# Patient Record
Sex: Female | Born: 1990 | Race: Black or African American | Hispanic: No | Marital: Single | Smoking: Never smoker
Health system: Southern US, Community
[De-identification: ages and names within clinical notes are randomized; demographics above are authoritative.]

## PROBLEM LIST (undated history)

## (undated) DIAGNOSIS — K900 Celiac disease: Secondary | ICD-10-CM

## (undated) HISTORY — PX: TONSILLECTOMY: SUR1361

---
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
## 2011-12-22 ENCOUNTER — Encounter (HOSPITAL_COMMUNITY): Payer: Self-pay | Admitting: Emergency Medicine

## 2011-12-22 ENCOUNTER — Emergency Department (INDEPENDENT_AMBULATORY_CARE_PROVIDER_SITE_OTHER)
Admission: EM | Admit: 2011-12-22 | Discharge: 2011-12-22 | Disposition: A | Discharge status: Home / Self Care | Payer: PRIVATE HEALTH INSURANCE | Source: Home / Self Care | Attending: Family Medicine | Admitting: Family Medicine

## 2011-12-22 ENCOUNTER — Emergency Department (INDEPENDENT_AMBULATORY_CARE_PROVIDER_SITE_OTHER): Payer: PRIVATE HEALTH INSURANCE

## 2011-12-22 DIAGNOSIS — S93609A Unspecified sprain of unspecified foot, initial encounter: Secondary | ICD-10-CM

## 2011-12-22 HISTORY — DX: Celiac disease: K90.0

## 2011-12-22 NOTE — ED Provider Notes (Signed)
History     CSN: [id number]  Arrival date & time 12/22/11  1347   First MD Initiated Contact with Patient 12/22/11 1430      Chief Complaint  Patient presents with  . Foot Pain    (Consider location/radiation/quality/duration/timing/severity/associated sxs/prior treatment) HPI Comments: Olivia Johns presents for evaluation of pain and swelling in her left foot. Olivia Johns reports that Olivia Johns tripped yesterday evening, plantar flexing her foot, but Olivia Johns is really unsure. Olivia Johns has been able to ambulate on the foot, but with noticeable pain. There is also swelling around the dorsum of the midfoot, and lateral malleolus. There is no obvious bruising or deformity. Olivia Johns states that Olivia Johns was wearing closed toed tennis shoes at the time. Olivia Johns denies any numbness, tingling, or weakness.  Patient is a 21 y.o. female presenting with foot injury. The history is provided by the patient.  Foot Injury  The incident occurred yesterday. The incident occurred at home. The injury mechanism was a fall. The pain is present in the left foot. The quality of the pain is described as aching. The pain is moderate. The pain has been constant since onset. Pertinent negatives include no numbness, no inability to bear weight, no loss of sensation and no tingling. The symptoms are aggravated by bearing weight, palpation and activity. Olivia Johns has tried rest for the symptoms.    Past Medical History  Diagnosis Date  . Vitamin d deficiency   . Celiac disease   . Vitamin d deficiency     Past Surgical History  Procedure Date  . Tonsillectomy     No family history on file.  History  Substance Use Topics  . Smoking status: Never Smoker   . Smokeless tobacco: Not on file  . Alcohol Use: No    OB History    Grav Para Term Preterm Abortions TAB SAB Ect Mult Living                  Review of Systems  Constitutional: Negative.   HENT: Negative.   Eyes: Negative.   Respiratory: Negative.   Cardiovascular: Negative.     Gastrointestinal: Negative.   Genitourinary: Negative.   Musculoskeletal:       LEFT foot swelling and pain  Skin: Negative.   Neurological: Negative.  Negative for tingling and numbness.    Allergies  Review of patient's allergies indicates no known allergies.  Home Medications   Current Outpatient Rx  Name Route Sig Dispense Refill  . LEVORA 0.15/30 (28) PO Oral Take by mouth.    Marland Kitchen BIOTIN PO Oral Take 1 tablet by mouth daily.    . IBUPROFEN 800 MG PO TABS Oral Take 1 tablet (800 mg total) by mouth every 8 (eight) hours as needed for pain. 30 tablet 0  . ADULT MULTIVITAMIN W/MINERALS CH Oral Take 1 tablet by mouth daily.    Marland Kitchen VITAMIN D (ERGOCALCIFEROL) 50000 UNITS PO CAPS Oral Take 50,000 Units by mouth 3 (three) times a week. Every Monday, Wednesday, and Friday      BP 125/71  Pulse 85  Temp(Src) 98.6 F (37 C) (Oral)  Resp 16  SpO2 97%  LMP 12/15/2011  Physical Exam  Nursing note and vitals reviewed. Constitutional: Olivia Johns is oriented to person, place, and time. Olivia Johns appears well-developed and well-nourished.  HENT:  Head: Normocephalic and atraumatic.  Eyes: EOM are normal.  Neck: Normal range of motion.  Pulmonary/Chest: Effort normal.  Musculoskeletal: Normal range of motion.       Left ankle: Olivia Johns  exhibits normal range of motion, no swelling, no deformity and normal pulse. No lateral malleolus, no medial malleolus, no AITFL, no CF ligament, no posterior TFL, no head of 5th metatarsal and no proximal fibula tenderness found.       Left foot: Olivia Johns exhibits tenderness and swelling. Olivia Johns exhibits normal range of motion, normal capillary refill, no deformity and no laceration.       Feet:  Neurological: Olivia Johns is alert and oriented to person, place, and time.  Skin: Skin is warm and dry.  Psychiatric: Her behavior is normal.    ED Course  Procedures (including critical care time)  Labs Reviewed - No data to display Dg Chest 2 View  12/23/2011  *RADIOLOGY REPORT*   Clinical Data: 21 year old female with chest pain, extremity swelling.  CHEST - 2 VIEW  Comparison: 09/23/2010.  Findings: Normal lung volumes. Normal cardiac size and mediastinal contours.  Visualized tracheal air column is within normal limits. No pneumothorax, pulmonary edema, pleural effusion or confluent pulmonary opacity. No osseous abnormality identified.  IMPRESSION: Negative, no acute cardiopulmonary abnormality.  Original Report Authenticated By: Harley Hallmark, M.D.   Dg Foot Complete Left  12/22/2011  *RADIOLOGY REPORT*  Clinical Data: Foot pain for 2 days.  LEFT FOOT - COMPLETE 3+ VIEW  Comparison: None.  Findings: Imaged bones, joints and soft tissues appear normal.  IMPRESSION: Negative exam.  Original Report Authenticated By: Bernadene Bell. D'ALESSIO, M.D.     1. Foot sprain       MDM  Xray reviewed by radiologist and myself, no acute findings; no fracture; placed in post-op shoe for comfort; given [facility] for follow-up if no improvement in sx        Renaee Munda, MD 12/23/11 [phone]

## 2011-12-22 NOTE — Discharge Instructions (Signed)
Your xrays were negative for any fracture or bony abnormality. Wear the hard-soled shoe for at least a week, then try walking without it. If you are pain-free, you can discontinue the shoe. If your pain persists, continue the shoe for another week. If you are still having pain after 2 weeks, then re-present for evaluation. Take ibuprofen or naproxen (Aleve) around the clock for baseline pain control. Use prescription pain medication only as needed and as directed for severe pain. If no improvement, please call the Orthopaedic practice listed on your discharge papers for further evaluation.

## 2011-12-22 NOTE — ED Notes (Signed)
Noticed left foot swelling yesterday.  Does remember tripping yesterday, but did not notice pain/swelling until much later that evening and not sure if tripping was related.  Pain includes little toe and radiates along outer edge of foot to the heel, but not including the heel.  Reports uncomfortable to walk.  Wiggles toes, no numbness or tingling.

## 2011-12-23 ENCOUNTER — Emergency Department (HOSPITAL_COMMUNITY): Payer: No Typology Code available for payment source

## 2011-12-23 ENCOUNTER — Encounter (HOSPITAL_COMMUNITY): Payer: Self-pay

## 2011-12-23 ENCOUNTER — Other Ambulatory Visit: Payer: Self-pay

## 2011-12-23 ENCOUNTER — Emergency Department (HOSPITAL_COMMUNITY)
Admission: EM | Admit: 2011-12-23 | Discharge: 2011-12-23 | Disposition: A | Discharge status: Home / Self Care | Payer: No Typology Code available for payment source | Attending: Emergency Medicine | Admitting: Emergency Medicine

## 2011-12-23 DIAGNOSIS — R0602 Shortness of breath: Secondary | ICD-10-CM

## 2011-12-23 DIAGNOSIS — R072 Precordial pain: Secondary | ICD-10-CM | POA: Insufficient documentation

## 2011-12-23 DIAGNOSIS — I82459 Acute embolism and thrombosis of unspecified peroneal vein: Secondary | ICD-10-CM

## 2011-12-23 DIAGNOSIS — M7989 Other specified soft tissue disorders: Secondary | ICD-10-CM

## 2011-12-23 DIAGNOSIS — M79609 Pain in unspecified limb: Secondary | ICD-10-CM | POA: Insufficient documentation

## 2011-12-23 DIAGNOSIS — I824Z9 Acute embolism and thrombosis of unspecified deep veins of unspecified distal lower extremity: Secondary | ICD-10-CM | POA: Insufficient documentation

## 2011-12-23 LAB — POCT I-STAT, CHEM 8
BUN: 5 mg/dL — ABNORMAL LOW (ref 6–23)
Calcium, Ion: 1.22 mmol/L (ref 1.12–1.32)
Chloride: 104 mEq/L (ref 96–112)
HCT: 36 % (ref 36.0–46.0)
Sodium: 140 mEq/L (ref 135–145)

## 2011-12-23 LAB — PREGNANCY, URINE: Preg Test, Ur: NEGATIVE

## 2011-12-23 MED ORDER — IBUPROFEN 800 MG PO TABS
800.0000 mg | ORAL_TABLET | Freq: Once | ORAL | Status: AC
Start: 2011-12-23 — End: 2011-12-23
  Administered 2011-12-23: 800 mg via ORAL
  Filled 2011-12-23: qty 1

## 2011-12-23 MED ORDER — ACETAMINOPHEN 325 MG PO TABS
650.0000 mg | ORAL_TABLET | Freq: Once | ORAL | Status: AC
  Administered 2011-12-23: 650 mg via ORAL
  Filled 2011-12-23: qty 2

## 2011-12-23 MED ORDER — KETOROLAC TROMETHAMINE 60 MG/2ML IM SOLN
60.0000 mg | Freq: Once | INTRAMUSCULAR | Status: DC
  Filled 2011-12-23: qty 2

## 2011-12-23 MED ORDER — ENOXAPARIN SODIUM 100 MG/ML ~~LOC~~ SOLN
70.0000 mg | Freq: Once | SUBCUTANEOUS | Status: AC
  Administered 2011-12-23: 70 mg via SUBCUTANEOUS
  Filled 2011-12-23: qty 1

## 2011-12-23 MED ORDER — POTASSIUM CHLORIDE CRYS ER 20 MEQ PO TBCR
40.0000 meq | EXTENDED_RELEASE_TABLET | Freq: Once | ORAL | Status: AC
  Administered 2011-12-23: 40 meq via ORAL
  Filled 2011-12-23: qty 2

## 2011-12-23 MED ORDER — ENOXAPARIN SODIUM 150 MG/ML ~~LOC~~ SOLN: 1.0000 mg/kg | Freq: Two times a day (BID) | SUBCUTANEOUS | Status: DC

## 2011-12-23 MED ORDER — IBUPROFEN 800 MG PO TABS: 800.0000 mg | ORAL_TABLET | Freq: Three times a day (TID) | ORAL | Status: DC | PRN

## 2011-12-23 MED ORDER — IOHEXOL 350 MG/ML SOLN
70.0000 mL | Freq: Once | INTRAVENOUS | Status: AC | PRN
  Administered 2011-12-23: 70 mL via INTRAVENOUS

## 2011-12-23 MED ORDER — OXYCODONE-ACETAMINOPHEN 5-325 MG PO TABS: 1.0000 | ORAL_TABLET | ORAL | Status: DC | PRN

## 2011-12-23 MED ORDER — PATIENT'S GUIDE TO USING COUMADIN BOOK
Freq: Once | Status: AC
  Administered 2011-12-23: 19:00:00
  Filled 2011-12-23: qty 1

## 2011-12-23 MED ORDER — ENOXAPARIN (LOVENOX) PATIENT EDUCATION KIT
PACK | Freq: Once | Status: AC
  Administered 2011-12-23: 18:00:00
  Filled 2011-12-23: qty 1

## 2011-12-23 MED ORDER — WARFARIN SODIUM 5 MG PO TABS: 5.0000 mg | ORAL_TABLET | Freq: Every day | ORAL | Status: DC

## 2011-12-23 MED ORDER — WARFARIN SODIUM 5 MG PO TABS
5.0000 mg | ORAL_TABLET | Freq: Once | ORAL | Status: AC
  Administered 2011-12-23: 5 mg via ORAL
  Filled 2011-12-23: qty 1

## 2011-12-23 NOTE — ED Notes (Signed)
Pt to vascular for doppler study

## 2011-12-23 NOTE — Progress Notes (Signed)
*  PRELIMINARY RESULTS* Vascular Ultrasound Left lower extremity venous duplex has been completed.   No evidence of right common femoral vein deep vein thrombosis. No evidence of left lower extremity deep vein thrombosis with the exception of the proximal to distal segments of the peroneal veins which are noncompressible and dilated with debris visualized within the lumen, which is suggestive of acute thrombosis.  Malachy Moan, RDMS, RDCS 12/23/2011, 1:44 PM

## 2011-12-23 NOTE — ED Provider Notes (Signed)
History     CSN: [id number]  Arrival date & time 12/23/11  4098   First MD Initiated Contact with Patient 12/23/11 0630      Chief Complaint  Patient presents with  . Chest Pain    (Consider location/radiation/quality/duration/timing/severity/associated sxs/prior treatment) HPI The patient presents to the ER with chest pain that started this morning as pain along her sternum. She states that the pain is worse with movement, breathing deep and palpation. The patient states that she has no cardiac history. She states that she has had no leg or calf swelling, no redness in either leg, or shortness of breath. Patient denies N/V, sweating, weakness, abd pain, fever, cough, headache or visual changes. The patient did not try any treatment for her symptoms. Past Medical History  Diagnosis Date  . Vitamin d deficiency   . Celiac disease   . Vitamin d deficiency     Past Surgical History  Procedure Date  . Tonsillectomy     History reviewed. No pertinent family history.  History  Substance Use Topics  . Smoking status: Never Smoker   . Smokeless tobacco: Not on file  . Alcohol Use: No    OB History    Grav Para Term Preterm Abortions TAB SAB Ect Mult Living                  Review of Systems All pertinent positives and negatives reviewed in the history of present illness  Allergies  Review of patient's allergies indicates no known allergies.  Home Medications   Current Outpatient Rx  Name Route Sig Dispense Refill  . BIOTIN PO Oral Take 1 tablet by mouth daily.    Marland Kitchen LEVORA 0.15/30 (28) PO Oral Take by mouth.    . ADULT MULTIVITAMIN W/MINERALS CH Oral Take 1 tablet by mouth daily.    Marland Kitchen VITAMIN D (ERGOCALCIFEROL) 50000 UNITS PO CAPS Oral Take 50,000 Units by mouth 3 (three) times a week. Every Monday, Wednesday, and Friday      BP 124/64  Temp(Src) 99.1 F (37.3 C) (Oral)  Resp 24  SpO2 100%  LMP 12/15/2011  Physical Exam  Constitutional: She appears  well-developed and well-nourished. No distress.  HENT:  Head: Normocephalic and atraumatic.  Mouth/Throat: Oropharynx is clear and moist. No oropharyngeal exudate.  Eyes: Pupils are equal, round, and reactive to light.  Cardiovascular: Normal rate, regular rhythm and normal heart sounds.  Exam reveals no gallop and no friction rub.   No murmur heard. Pulmonary/Chest: Effort normal and breath sounds normal. No respiratory distress. She has no wheezes. She has no rales. She exhibits tenderness. She exhibits no crepitus and no deformity.    Musculoskeletal:       Right lower leg: She exhibits no swelling and no edema.       Left lower leg: She exhibits no swelling and no edema.    ED Course  Procedures (including critical care time)   Labs Reviewed  PREGNANCY, URINE   Dg Chest 2 View  12/23/2011  *RADIOLOGY REPORT*  Clinical Data: 21 year old female with chest pain, extremity swelling.  CHEST - 2 VIEW  Comparison: 09/23/2010.  Findings: Normal lung volumes. Normal cardiac size and mediastinal contours.  Visualized tracheal air column is within normal limits. No pneumothorax, pulmonary edema, pleural effusion or confluent pulmonary opacity. No osseous abnormality identified.  IMPRESSION: Negative, no acute cardiopulmonary abnormality.  Original Report Authenticated By: Harley Hallmark, M.D.   Dg Foot Complete Left  12/22/2011  *RADIOLOGY REPORT*  Clinical Data: Foot pain for 2 days.  LEFT FOOT - COMPLETE 3+ VIEW  Comparison: None.  Findings: Imaged bones, joints and soft tissues appear normal.  IMPRESSION: Negative exam.  Original Report Authenticated By: Bernadene Bell. Maricela Curet, M.D.    The patient has what appears to be chest wall type pain. She is low risk based on Wells Criteria. She has no cardiac history or predisposing factors for early cardiac disease. The patient is advised to return here as needed. These symptoms seem atypical for cardiac chest pain.  10:44 AM The patient now is  complaining of lower extremity pain that she did not previously mention.The patient states now that this is her main complaint at this time. I advised that we will check a venous doppler.  02:45 PM The patient is found to have a small clot in her lower peroneal vein. The patient states she is now having chest discomfort again.  Will move to the CDU for further care and eval. CTA ordered of her chest. MDM   Date: 12/23/2011  Rate: *80   Rhythm: normal sinus rhythm  QRS Axis: normal  Intervals: normal  ST/T Wave abnormalities: normal  Conduction Disutrbances:none  Narrative Interpretation:   Old EKG Reviewed: none available          Carlyle Dolly, PA-C 12/23/11 0859  Carlyle Dolly, PA-C 12/23/11 0900  Carlyle Dolly, PA-C 12/23/11 1457

## 2011-12-23 NOTE — ED Notes (Signed)
Woke with CP 8/10 described as a dull flutter.   Sprained ankle yesterday foot turning blue but color improves with ambulation

## 2011-12-23 NOTE — ED Notes (Signed)
Phoned CT to inquire about exam. Patient is on their list and they will come get the patient shortly.

## 2011-12-23 NOTE — Discharge Instructions (Signed)
Olivia Johns you have a very small clot to the perineal vein today.  We will start you on Lovenox shots and Coumadin and today. He will give yourself the shots twice a day every 12 hours. You will take the Coumadin once a day. On Sunday he will go to the urgent care at [facility] on the located at [address] The phone number is [phone]. Call for an appointment tonight. You will be getting her INR checked. From there he will be instructed on how to take your medication. Return to the ER for severe shortness of breath or severe chest pain. Do not take ibuprofen while on coumadin.  Continue the birth control pills. There is also a pneumomediastinum present which is a small amount of air outside the lung.  This will be absorbed but return if you have increased chest pain that is severe.    Deep Vein Thrombosis A deep vein thrombosis (DVT) is a blood clot (thrombus) that develops in a deep vein. A DVT is a clot in the deep, larger veins of the leg, arm, or pelvis. These are more dangerous than clots that might form in veins on the surface of the body. Deep vein thrombosis can lead to complications if the clot breaks off and travels in the bloodstream to the lungs. CAUSES Blood clots form in a vein for different reasons. Usually several things cause blood clots. They include:  The flow of blood slows down.   The inside of the vein is damaged in some way.   The person has a condition that makes blood clot more easily. These conditions may include:   Older age (especially over 30 years old).   Having a history of blood clots.   Having major or lengthy surgery. Hip surgery is particularly high-risk.   Breaking a hip or leg.   Sitting or lying still for a long time.   Cancer or cancer treatment.   Having a long, thin tube (catheter) placed inside a vein during a medical procedure.   Being overweight (obese).   Pregnancy and childbirth.   Medicines with estrogen.   Smoking.   Other  circulation or heart problems.  SYMPTOMS When a clot forms, it can either partially or totally block the blood flow in that vein. Symptoms of a DVT can include:  Swelling of the leg or arm, especially if one side is much worse.   Warmth and redness of the leg or arm, especially if one side is much worse.   Pain in an arm or leg. If the clot is in the leg, symptoms may be more noticeable or worse when standing or walking.  If the blood clot travels to the lung, it may cause:  Shortness of breath.   Chest pain. The pain may be worsened by deep breaths.   Coughing up thick mucus (phlegm), possibly flecked with blood.  Anyone with these symptoms should get emergency medical treatment right away. Call your local emergency services (911 in [place] if you have these symptoms. DIAGNOSIS If a DVT is suspected, your caregiver will take a full medical history. He or she will also perform a physical exam. Tests that also may be required include:  Studies of the clotting properties of the blood.   An ultrasound scan.   X-rays to show the flow of blood when special dye is injected into the veins (venography).   Studies of your lungs if you have any chest symptoms.  PREVENTION  Exercise the  legs regularly. Take a brisk 30 minute walk every day.   Maintain a weight that is appropriate for your height.   Avoid sitting or lying in bed for long periods of time without moving your legs.   Women, particularly those over the age of 65, should consider the risks and benefits of taking estrogen medicines, including birth control pills.   Do not smoke, especially if you take estrogen medicines.   Long-distance travel can increase your risk. You should exercise your legs by walking or pumping the muscles every hour.   In hospital prevention:   Prevention may include medical and nonmedical measures.  TREATMENT  The most common treatment for DVT is blood thinning (anticoagulant) medicine, which  reduces the blood's tendency to clot. Anticoagulants can stop new blood clots from forming and old ones from growing. They cannot dissolve existing clots. Your body does this by itself over time. Anticoagulants can be given by mouth, by intravenous (IV) access, or by injection. Your caregiver will determine the best program for you.   Less commonly, clot-dissolving drugs (thrombolytics) are used to dissolve a DVT. They carry a high risk of bleeding, so they are used mainly in severe cases.   Very rarely, a blood clot in the leg needs to be removed surgically.   If you are unable to take anticoagulants, your caregiver may arrange for you to have a filter placed in a main vein in your belly (abdomen). This filter prevents clots from traveling to your lungs.  HOME CARE INSTRUCTIONS  Take all medicines prescribed by your caregiver. Follow the directions carefully.   You will most likely continue taking anticoagulants after you leave the hospital. Your caregiver will advise you on the length of treatment (usually 3 to 6 months, sometimes for life).   Taking too much or too little of an anticoagulant is dangerous. While taking this type of medicine, you will need to have regular blood tests to be sure the dose is correct. The dose can change for many reasons. It is critically important that you take this medicine exactly as prescribed, and that you have blood tests exactly as directed.   Many foods can interfere with anticoagulants. These include foods high in vitamin K, such as spinach, kale, broccoli, cabbage, collard and turnip greens, Brussels sprouts, peas, cauliflower, seaweed, parsley, beef and pork liver, green tea, and soybean oil. Your caregiver should discuss limits on these foods with you or you should arrange a visit with a dietician to answer your questions.   Many medicines can interfere with anticoagulants. You must tell your caregiver about any and all medicines you take. This includes  all vitamins and supplements. Be especially cautious with aspirin and anti-inflammatory medicines. Ask your caregiver before taking these.   Anticoagulants can have side effects, mostly excessive bruising or bleeding. You will need to hold pressure over cuts for longer than usual. Avoid alcoholic drinks or consume only very small amounts while taking this medicine.   If you are taking an anticoagulant:   Wear a medical alert bracelet.   Notify your dentist or other caregivers before procedures.   Avoid contact sports.   Ask your caregiver how soon you can go back to normal activities. Not being active can lead to new clots. Ask for a list of what you should and should not do.   Exercise your lower leg muscles. This is important while traveling.   You may need to wear compression stockings. These are tight elastic stockings that  apply pressure to the lower legs. This can help keep the blood in the legs from clotting.   If you are a smoker, you should quit.   Learn as much as you can about DVT.  SEEK MEDICAL CARE IF:  You have unusual bruising or any bleeding problems.   The swelling or pain in your affected arm or leg is not gradually improving.   You anticipate surgery or long-distance travel. You should get specific advice on DVT prevention.   You discover other family members with blood clots. This may require further testing for inherited diseases or conditions.  SEEK IMMEDIATE MEDICAL CARE IF:  You develop chest pain.   You develop severe shortness of breath.   You begin to cough up bloody mucus or phlegm (sputum).   You feel dizzy or faint.   You develop swelling or pain in the leg.   You have breathing problems after traveling.  MAKE SURE YOU:  Understand these instructions.   Will watch your condition.   Will get help right away if you are not doing well or get worse.  Document Released: 08/22/2005 Document Revised: 08/11/2011 Document Reviewed:  10/14/2010 [facility] Patient Information 2012 [place], [place].Deep Vein Thrombosis A deep vein thrombosis (DVT) is a blood clot (thrombus) that develops in a deep vein. A DVT is a clot in the deep, larger veins of the leg, arm, or pelvis. These are more dangerous than clots that might form in veins on the surface of the body. Deep vein thrombosis can lead to complications if the clot breaks off and travels in the bloodstream to the lungs. CAUSES Blood clots form in a vein for different reasons. Usually several things cause blood clots. They include:  The flow of blood slows down.   The inside of the vein is damaged in some way.   The person has a condition that makes blood clot more easily. These conditions may include:   Older age (especially over 15 years old).   Having a history of blood clots.   Having major or lengthy surgery. Hip surgery is particularly high-risk.   Breaking a hip or leg.   Sitting or lying still for a long time.   Cancer or cancer treatment.   Having a long, thin tube (catheter) placed inside a vein during a medical procedure.   Being overweight (obese).   Pregnancy and childbirth.   Medicines with estrogen.   Smoking.   Other circulation or heart problems.  SYMPTOMS When a clot forms, it can either partially or totally block the blood flow in that vein. Symptoms of a DVT can include:  Swelling of the leg or arm, especially if one side is much worse.   Warmth and redness of the leg or arm, especially if one side is much worse.   Pain in an arm or leg. If the clot is in the leg, symptoms may be more noticeable or worse when standing or walking.  If the blood clot travels to the lung, it may cause:  Shortness of breath.   Chest pain. The pain may be worsened by deep breaths.   Coughing up thick mucus (phlegm), possibly flecked with blood.  Anyone with these symptoms should get emergency medical treatment right away. Call your local emergency  services (911 in [place] if you have these symptoms. DIAGNOSIS If a DVT is suspected, your caregiver will take a full medical history. He or she will also perform a physical exam. Tests that also may be required  include:  Studies of the clotting properties of the blood.   An ultrasound scan.   X-rays to show the flow of blood when special dye is injected into the veins (venography).   Studies of your lungs if you have any chest symptoms.  PREVENTION  Exercise the legs regularly. Take a brisk 30 minute walk every day.   Maintain a weight that is appropriate for your height.   Avoid sitting or lying in bed for long periods of time without moving your legs.   Women, particularly those over the age of 63, should consider the risks and benefits of taking estrogen medicines, including birth control pills.   Do not smoke, especially if you take estrogen medicines.   Long-distance travel can increase your risk. You should exercise your legs by walking or pumping the muscles every hour.   In hospital prevention:   Prevention may include medical and nonmedical measures.  TREATMENT  The most common treatment for DVT is blood thinning (anticoagulant) medicine, which reduces the blood's tendency to clot. Anticoagulants can stop new blood clots from forming and old ones from growing. They cannot dissolve existing clots. Your body does this by itself over time. Anticoagulants can be given by mouth, by intravenous (IV) access, or by injection. Your caregiver will determine the best program for you.   Less commonly, clot-dissolving drugs (thrombolytics) are used to dissolve a DVT. They carry a high risk of bleeding, so they are used mainly in severe cases.   Very rarely, a blood clot in the leg needs to be removed surgically.   If you are unable to take anticoagulants, your caregiver may arrange for you to have a filter placed in a main vein in your belly (abdomen). This filter prevents clots from  traveling to your lungs.  HOME CARE INSTRUCTIONS  Take all medicines prescribed by your caregiver. Follow the directions carefully.   You will most likely continue taking anticoagulants after you leave the hospital. Your caregiver will advise you on the length of treatment (usually 3 to 6 months, sometimes for life).   Taking too much or too little of an anticoagulant is dangerous. While taking this type of medicine, you will need to have regular blood tests to be sure the dose is correct. The dose can change for many reasons. It is critically important that you take this medicine exactly as prescribed, and that you have blood tests exactly as directed.   Many foods can interfere with anticoagulants. These include foods high in vitamin K, such as spinach, kale, broccoli, cabbage, collard and turnip greens, Brussels sprouts, peas, cauliflower, seaweed, parsley, beef and pork liver, green tea, and soybean oil. Your caregiver should discuss limits on these foods with you or you should arrange a visit with a dietician to answer your questions.   Many medicines can interfere with anticoagulants. You must tell your caregiver about any and all medicines you take. This includes all vitamins and supplements. Be especially cautious with aspirin and anti-inflammatory medicines. Ask your caregiver before taking these.   Anticoagulants can have side effects, mostly excessive bruising or bleeding. You will need to hold pressure over cuts for longer than usual. Avoid alcoholic drinks or consume only very small amounts while taking this medicine.   If you are taking an anticoagulant:   Wear a medical alert bracelet.   Notify your dentist or other caregivers before procedures.   Avoid contact sports.   Ask your caregiver how soon you can go back to  normal activities. Not being active can lead to new clots. Ask for a list of what you should and should not do.   Exercise your lower leg muscles. This is  important while traveling.   You may need to wear compression stockings. These are tight elastic stockings that apply pressure to the lower legs. This can help keep the blood in the legs from clotting.   If you are a smoker, you should quit.   Learn as much as you can about DVT.  SEEK MEDICAL CARE IF:  You have unusual bruising or any bleeding problems.   The swelling or pain in your affected arm or leg is not gradually improving.   You anticipate surgery or long-distance travel. You should get specific advice on DVT prevention.   You discover other family members with blood clots. This may require further testing for inherited diseases or conditions.  SEEK IMMEDIATE MEDICAL CARE IF:  You develop chest pain.   You develop severe shortness of breath.   You begin to cough up bloody mucus or phlegm (sputum).   You feel dizzy or faint.   You develop swelling or pain in the leg.   You have breathing problems after traveling.  MAKE SURE YOU:  Understand these instructions.   Will watch your condition.   Will get help right away if you are not doing well or get worse.  Document Released: 08/22/2005 Document Revised: 08/11/2011 Document Reviewed: 10/14/2010 [facility] Patient Information 2012 [place], [place].Deep Vein Thrombosis A deep vein thrombosis (DVT) is a blood clot (thrombus) that develops in a deep vein. A DVT is a clot in the deep, larger veins of the leg, arm, or pelvis. These are more dangerous than clots that might form in veins on the surface of the body. Deep vein thrombosis can lead to complications if the clot breaks off and travels in the bloodstream to the lungs. CAUSES Blood clots form in a vein for different reasons. Usually several things cause blood clots. They include:  The flow of blood slows down.   The inside of the vein is damaged in some way.   The person has a condition that makes blood clot more easily. These conditions may include:   Older age  (especially over 33 years old).   Having a history of blood clots.   Having major or lengthy surgery. Hip surgery is particularly high-risk.   Breaking a hip or leg.   Sitting or lying still for a long time.   Cancer or cancer treatment.   Having a long, thin tube (catheter) placed inside a vein during a medical procedure.   Being overweight (obese).   Pregnancy and childbirth.   Medicines with estrogen.   Smoking.   Other circulation or heart problems.  SYMPTOMS When a clot forms, it can either partially or totally block the blood flow in that vein. Symptoms of a DVT can include:  Swelling of the leg or arm, especially if one side is much worse.   Warmth and redness of the leg or arm, especially if one side is much worse.   Pain in an arm or leg. If the clot is in the leg, symptoms may be more noticeable or worse when standing or walking.  If the blood clot travels to the lung, it may cause:  Shortness of breath.   Chest pain. The pain may be worsened by deep breaths.   Coughing up thick mucus (phlegm), possibly flecked with blood.  Anyone with these symptoms  should get emergency medical treatment right away. Call your local emergency services (911 in [place] if you have these symptoms. DIAGNOSIS If a DVT is suspected, your caregiver will take a full medical history. He or she will also perform a physical exam. Tests that also may be required include:  Studies of the clotting properties of the blood.   An ultrasound scan.   X-rays to show the flow of blood when special dye is injected into the veins (venography).   Studies of your lungs if you have any chest symptoms.  PREVENTION  Exercise the legs regularly. Take a brisk 30 minute walk every day.   Maintain a weight that is appropriate for your height.   Avoid sitting or lying in bed for long periods of time without moving your legs.   Women, particularly those over the age of 72, should consider the risks and  benefits of taking estrogen medicines, including birth control pills.   Do not smoke, especially if you take estrogen medicines.   Long-distance travel can increase your risk. You should exercise your legs by walking or pumping the muscles every hour.   In hospital prevention:   Prevention may include medical and nonmedical measures.  TREATMENT  The most common treatment for DVT is blood thinning (anticoagulant) medicine, which reduces the blood's tendency to clot. Anticoagulants can stop new blood clots from forming and old ones from growing. They cannot dissolve existing clots. Your body does this by itself over time. Anticoagulants can be given by mouth, by intravenous (IV) access, or by injection. Your caregiver will determine the best program for you.   Less commonly, clot-dissolving drugs (thrombolytics) are used to dissolve a DVT. They carry a high risk of bleeding, so they are used mainly in severe cases.   Very rarely, a blood clot in the leg needs to be removed surgically.   If you are unable to take anticoagulants, your caregiver may arrange for you to have a filter placed in a main vein in your belly (abdomen). This filter prevents clots from traveling to your lungs.  HOME CARE INSTRUCTIONS  Take all medicines prescribed by your caregiver. Follow the directions carefully.   You will most likely continue taking anticoagulants after you leave the hospital. Your caregiver will advise you on the length of treatment (usually 3 to 6 months, sometimes for life).   Taking too much or too little of an anticoagulant is dangerous. While taking this type of medicine, you will need to have regular blood tests to be sure the dose is correct. The dose can change for many reasons. It is critically important that you take this medicine exactly as prescribed, and that you have blood tests exactly as directed.   Many foods can interfere with anticoagulants. These include foods high in vitamin K,  such as spinach, kale, broccoli, cabbage, collard and turnip greens, Brussels sprouts, peas, cauliflower, seaweed, parsley, beef and pork liver, green tea, and soybean oil. Your caregiver should discuss limits on these foods with you or you should arrange a visit with a dietician to answer your questions.   Many medicines can interfere with anticoagulants. You must tell your caregiver about any and all medicines you take. This includes all vitamins and supplements. Be especially cautious with aspirin and anti-inflammatory medicines. Ask your caregiver before taking these.   Anticoagulants can have side effects, mostly excessive bruising or bleeding. You will need to hold pressure over cuts for longer than usual. Avoid alcoholic drinks or consume  only very small amounts while taking this medicine.   If you are taking an anticoagulant:   Wear a medical alert bracelet.   Notify your dentist or other caregivers before procedures.   Avoid contact sports.   Ask your caregiver how soon you can go back to normal activities. Not being active can lead to new clots. Ask for a list of what you should and should not do.   Exercise your lower leg muscles. This is important while traveling.   You may need to wear compression stockings. These are tight elastic stockings that apply pressure to the lower legs. This can help keep the blood in the legs from clotting.   If you are a smoker, you should quit.   Learn as much as you can about DVT.  SEEK MEDICAL CARE IF:  You have unusual bruising or any bleeding problems.   The swelling or pain in your affected arm or leg is not gradually improving.   You anticipate surgery or long-distance travel. You should get specific advice on DVT prevention.   You discover other family members with blood clots. This may require further testing for inherited diseases or conditions.  SEEK IMMEDIATE MEDICAL CARE IF:  You develop chest pain.   You develop severe  shortness of breath.   You begin to cough up bloody mucus or phlegm (sputum).   You feel dizzy or faint.   You develop swelling or pain in the leg.   You have breathing problems after traveling.  MAKE SURE YOU:  Understand these instructions.   Will watch your condition.   Will get help right away if you are not doing well or get worse.  Document Released: 08/22/2005 Document Revised: 08/11/2011 Document Reviewed: 10/14/2010 [facility] Patient Information 2012 [place], [place]. Deep Vein Thrombosis A deep vein thrombosis (DVT) is a blood clot (thrombus) that develops in a deep vein. A DVT is a clot in the deep, larger veins of the leg, arm, or pelvis. These are more dangerous than clots that might form in veins on the surface of the body. Deep vein thrombosis can lead to complications if the clot breaks off and travels in the bloodstream to the lungs. CAUSES Blood clots form in a vein for different reasons. Usually several things cause blood clots. They include:  The flow of blood slows down.   The inside of the vein is damaged in some way.   The person has a condition that makes blood clot more easily. These conditions may include:   Older age (especially over 26 years old).   Having a history of blood clots.   Having major or lengthy surgery. Hip surgery is particularly high-risk.   Breaking a hip or leg.   Sitting or lying still for a long time.   Cancer or cancer treatment.   Having a long, thin tube (catheter) placed inside a vein during a medical procedure.   Being overweight (obese).   Pregnancy and childbirth.   Medicines with estrogen.   Smoking.   Other circulation or heart problems.  SYMPTOMS When a clot forms, it can either partially or totally block the blood flow in that vein. Symptoms of a DVT can include:  Swelling of the leg or arm, especially if one side is much worse.   Warmth and redness of the leg or arm, especially if one side is much worse.    Pain in an arm or leg. If the clot is in the leg, symptoms may be more  noticeable or worse when standing or walking.  If the blood clot travels to the lung, it may cause:  Shortness of breath.   Chest pain. The pain may be worsened by deep breaths.   Coughing up thick mucus (phlegm), possibly flecked with blood.  Anyone with these symptoms should get emergency medical treatment right away. Call your local emergency services (911 in [place] if you have these symptoms. DIAGNOSIS If a DVT is suspected, your caregiver will take a full medical history. He or she will also perform a physical exam. Tests that also may be required include:  Studies of the clotting properties of the blood.   An ultrasound scan.   X-rays to show the flow of blood when special dye is injected into the veins (venography).   Studies of your lungs if you have any chest symptoms.  PREVENTION  Exercise the legs regularly. Take a brisk 30 minute walk every day.   Maintain a weight that is appropriate for your height.   Avoid sitting or lying in bed for long periods of time without moving your legs.   Women, particularly those over the age of 38, should consider the risks and benefits of taking estrogen medicines, including birth control pills.   Do not smoke, especially if you take estrogen medicines.   Long-distance travel can increase your risk. You should exercise your legs by walking or pumping the muscles every hour.   In hospital prevention:   Prevention may include medical and nonmedical measures.  TREATMENT  The most common treatment for DVT is blood thinning (anticoagulant) medicine, which reduces the blood's tendency to clot. Anticoagulants can stop new blood clots from forming and old ones from growing. They cannot dissolve existing clots. Your body does this by itself over time. Anticoagulants can be given by mouth, by intravenous (IV) access, or by injection. Your caregiver will determine the best  program for you.   Less commonly, clot-dissolving drugs (thrombolytics) are used to dissolve a DVT. They carry a high risk of bleeding, so they are used mainly in severe cases.   Very rarely, a blood clot in the leg needs to be removed surgically.   If you are unable to take anticoagulants, your caregiver may arrange for you to have a filter placed in a main vein in your belly (abdomen). This filter prevents clots from traveling to your lungs.  HOME CARE INSTRUCTIONS  Take all medicines prescribed by your caregiver. Follow the directions carefully.   You will most likely continue taking anticoagulants after you leave the hospital. Your caregiver will advise you on the length of treatment (usually 3 to 6 months, sometimes for life).   Taking too much or too little of an anticoagulant is dangerous. While taking this type of medicine, you will need to have regular blood tests to be sure the dose is correct. The dose can change for many reasons. It is critically important that you take this medicine exactly as prescribed, and that you have blood tests exactly as directed.   Many foods can interfere with anticoagulants. These include foods high in vitamin K, such as spinach, kale, broccoli, cabbage, collard and turnip greens, Brussels sprouts, peas, cauliflower, seaweed, parsley, beef and pork liver, green tea, and soybean oil. Your caregiver should discuss limits on these foods with you or you should arrange a visit with a dietician to answer your questions.   Many medicines can interfere with anticoagulants. You must tell your caregiver about any and all medicines you  take. This includes all vitamins and supplements. Be especially cautious with aspirin and anti-inflammatory medicines. Ask your caregiver before taking these.   Anticoagulants can have side effects, mostly excessive bruising or bleeding. You will need to hold pressure over cuts for longer than usual. Avoid alcoholic drinks or consume  only very small amounts while taking this medicine.   If you are taking an anticoagulant:   Wear a medical alert bracelet.   Notify your dentist or other caregivers before procedures.   Avoid contact sports.   Ask your caregiver how soon you can go back to normal activities. Not being active can lead to new clots. Ask for a list of what you should and should not do.   Exercise your lower leg muscles. This is important while traveling.   You may need to wear compression stockings. These are tight elastic stockings that apply pressure to the lower legs. This can help keep the blood in the legs from clotting.   If you are a smoker, you should quit.   Learn as much as you can about DVT.  SEEK MEDICAL CARE IF:  You have unusual bruising or any bleeding problems.   The swelling or pain in your affected arm or leg is not gradually improving.   You anticipate surgery or long-distance travel. You should get specific advice on DVT prevention.   You discover other family members with blood clots. This may require further testing for inherited diseases or conditions.  SEEK IMMEDIATE MEDICAL CARE IF:  You develop chest pain.   You develop severe shortness of breath.   You begin to cough up bloody mucus or phlegm (sputum).   You feel dizzy or faint.   You develop swelling or pain in the leg.   You have breathing problems after traveling.  MAKE SURE YOU:  Understand these instructions.   Will watch your condition.   Will get help right away if you are not doing well or get worse.  Document Released: 08/22/2005 Document Revised: 08/11/2011 Document Reviewed: 10/14/2010 [facility] Patient Information 2012 [place], [place].

## 2011-12-23 NOTE — ED Notes (Signed)
Assumed patient care. Report received from [place], [place]

## 2011-12-23 NOTE — ED Notes (Signed)
Pt back from XR 

## 2011-12-23 NOTE — ED Notes (Signed)
Patient transported to CT 

## 2011-12-23 NOTE — ED Provider Notes (Signed)
1500 Report received from [place].  Ms Ottaway will be moved to CDU awaiting CT angio to r/o PE. U/s confirms L  Small clot in her lower perineal vein.  1830 CT angio shows no PE.  Small pneumomediastinum discussed with Dr. Freida Busman.  Will dc home on coumadin and lovenox.  Pt will follow up at [facility] for INR checks starting this Sunday.  She will take lovenox 70 mg bid and coumadin 5mg  every day.  Taught how to give own shots and watched education film.  Mother at bedside.   Remi Haggard, NP 12/24/11 1005

## 2011-12-23 NOTE — ED Notes (Signed)
Also states that it hurts when breathing.

## 2011-12-23 NOTE — ED Notes (Signed)
20g L AC by [facility]. 12 lead show NSR. 324 ASA given by [facility]

## 2011-12-24 ENCOUNTER — Encounter (HOSPITAL_COMMUNITY): Payer: Self-pay | Admitting: Emergency Medicine

## 2011-12-25 LAB — PROTIME-INR

## 2011-12-26 NOTE — ED Provider Notes (Signed)
Medical screening examination/treatment/procedure(s) were performed by non-physician practitioner and as supervising physician I was immediately available for consultation/collaboration. I have discussed the patient's case with PA Lawyer.  This otherwise well young F p/w migratory complaints, but was found to have L peroneal thrombosis, no PE on CTA (+small pneumomediastinum).  She was d/c w lovenox / coumadin to F/U as an outpatient.  Gerhard Munch, MD 12/26/11 [phone]

## 2011-12-26 NOTE — ED Provider Notes (Signed)
Please see my initial note.  This patient was transferred to CDU awaiting the remainder of her testing.  Gerhard Munch, MD 12/26/11 [phone]

## 2011-12-27 ENCOUNTER — Telehealth: Payer: Self-pay | Admitting: Family Medicine

## 2011-12-27 ENCOUNTER — Ambulatory Visit (INDEPENDENT_AMBULATORY_CARE_PROVIDER_SITE_OTHER): Payer: PRIVATE HEALTH INSURANCE | Admitting: Family Medicine

## 2011-12-27 ENCOUNTER — Ambulatory Visit: Payer: PRIVATE HEALTH INSURANCE

## 2011-12-27 VITALS — BP 103/66 | HR 89 | Temp 98.9°F | Resp 16 | Ht 67.75 in | Wt 152.2 lb

## 2011-12-27 DIAGNOSIS — I809 Phlebitis and thrombophlebitis of unspecified site: Secondary | ICD-10-CM

## 2011-12-27 DIAGNOSIS — Z7901 Long term (current) use of anticoagulants: Secondary | ICD-10-CM

## 2011-12-27 DIAGNOSIS — J982 Interstitial emphysema: Secondary | ICD-10-CM

## 2011-12-27 DIAGNOSIS — Z79899 Other long term (current) drug therapy: Secondary | ICD-10-CM

## 2011-12-27 LAB — PROTIME-INR
INR: 2.12 — ABNORMAL HIGH (ref <1.50)
Prothrombin Time: 24.5 seconds — ABNORMAL HIGH (ref 11.6–15.2)

## 2011-12-27 NOTE — Patient Instructions (Signed)
If you do not hear back from [place] back tomorrow morning for the INR report, please call back.

## 2011-12-27 NOTE — Telephone Encounter (Signed)
Notified Pt of PT/INR results. Per Dr. Alwyn Ren, Pt is to take 5 mg Coumadin QD and stop Lovenox. Pt states due to transportation, she will try and get in with [facility] this Friday- I told her that would be fine. Today's labs results: PT: 24.5 INR: 2.12  Pt will call back if she has any questions/concerns. Eileen Stanford

## 2011-12-27 NOTE — Progress Notes (Signed)
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 She was placed on Lovenox and Coumadin. She is here for a followup with regard to this. The initial diagnosis was made in the emergency room, then she was seen by her doctor in [place] this weekend. Her INR was 1.5, pro time of 19.7, and dose of Coumadin is one half of a 10 mg pill daily. She is on Lovenox 70 every 12 hours. He went into the emergency room also with a severe substernal chest pain. CT angiogram did not reveal any pulmonary embolism, but the CT did show a small pneumomediastinum.  Objective: Pleasant alert young lady in no acute distress at this time. She is coming by her mother today. Her chest is clear to auscultation. Heart regular without murmurs. Left calf is still tender, positive Homans sign. Not much swelling.  Assessment: DVT, for Coumadin regulation Pneumomediastinum Ankle pain  Plan: Check stat INR and do a chest x-ray on her. My advice to her was if she comes off of the hormonal birth control pills which she is on for acne and cycle control. [facility] reading (PRIMARY) by  Dr. Alwyn Ren Chest x-ray normal. No evidence of previously reported pneumo mediastinum. Will call back later when we get the INR.  Followup INR as will be done at [facility]

## 2011-12-28 ENCOUNTER — Emergency Department (HOSPITAL_COMMUNITY)
Admission: EM | Admit: 2011-12-28 | Discharge: 2011-12-28 | Disposition: A | Discharge status: Home / Self Care | Payer: PRIVATE HEALTH INSURANCE | Attending: Emergency Medicine | Admitting: Emergency Medicine

## 2011-12-28 ENCOUNTER — Encounter (HOSPITAL_COMMUNITY): Payer: Self-pay | Admitting: *Deleted

## 2011-12-28 ENCOUNTER — Telehealth: Payer: Self-pay

## 2011-12-28 DIAGNOSIS — R079 Chest pain, unspecified: Secondary | ICD-10-CM | POA: Insufficient documentation

## 2011-12-28 DIAGNOSIS — R209 Unspecified disturbances of skin sensation: Secondary | ICD-10-CM | POA: Insufficient documentation

## 2011-12-28 DIAGNOSIS — F419 Anxiety disorder, unspecified: Secondary | ICD-10-CM

## 2011-12-28 DIAGNOSIS — Z7901 Long term (current) use of anticoagulants: Secondary | ICD-10-CM | POA: Insufficient documentation

## 2011-12-28 DIAGNOSIS — Z86718 Personal history of other venous thrombosis and embolism: Secondary | ICD-10-CM | POA: Insufficient documentation

## 2011-12-28 DIAGNOSIS — I82409 Acute embolism and thrombosis of unspecified deep veins of unspecified lower extremity: Secondary | ICD-10-CM | POA: Insufficient documentation

## 2011-12-28 DIAGNOSIS — K900 Celiac disease: Secondary | ICD-10-CM | POA: Insufficient documentation

## 2011-12-28 DIAGNOSIS — Z79899 Other long term (current) drug therapy: Secondary | ICD-10-CM | POA: Insufficient documentation

## 2011-12-28 DIAGNOSIS — F411 Generalized anxiety disorder: Secondary | ICD-10-CM | POA: Insufficient documentation

## 2011-12-28 DIAGNOSIS — M79609 Pain in unspecified limb: Secondary | ICD-10-CM | POA: Insufficient documentation

## 2011-12-28 DIAGNOSIS — I82402 Acute embolism and thrombosis of unspecified deep veins of left lower extremity: Secondary | ICD-10-CM

## 2011-12-28 DIAGNOSIS — M7989 Other specified soft tissue disorders: Secondary | ICD-10-CM

## 2011-12-28 LAB — POCT I-STAT, CHEM 8
Calcium, Ion: 1.22 mmol/L (ref 1.12–1.32)
Creatinine, Ser: 0.8 mg/dL (ref 0.50–1.10)
Glucose, Bld: 83 mg/dL (ref 70–99)
HCT: 39 % (ref 36.0–46.0)
Hemoglobin: 13.3 g/dL (ref 12.0–15.0)
Potassium: 3.9 mEq/L (ref 3.5–5.1)

## 2011-12-28 LAB — PROTIME-INR: Prothrombin Time: 31.3 seconds — ABNORMAL HIGH (ref 11.6–15.2)

## 2011-12-28 NOTE — ED Notes (Addendum)
Reports being diagnosed with blood clot left leg on 4/19. Still having pain and reports now having tingling to right hand and leg that started this afternoon, reports this sensation is similar to how her left leg feels. No neuro deficits noted at triage, speech clear, grips equal, ambulatory at triage.

## 2011-12-28 NOTE — ED Notes (Signed)
Pt L ankle & L foot cool to the touch

## 2011-12-28 NOTE — ED Provider Notes (Signed)
History     CSN: [id number]  Arrival date & time 12/28/11  1725   None     Chief Complaint  Patient presents with  . 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  She uses birth control to regulate her mental cycles.  She was diagnosed with a DVT in her left calf on 419.  She also had a CT scan of her chest which revealed a pneumomediastinum that radiated to her neck.  She was placed on Coumadin.  She has been followed for INR, which is therapeutic at 2.96.  Today.  She returns to the emergency room today because she is still having significant discomfort in her left calf, but also has noted that in certain position.  She is having some tingling sensation of her right posterior calf and right foot that resolves when she is underlying flat or standing straight.  Her chest discomfort has remained stable.  She has not become tachycardic short of breath.  She had a repeat chest x-ray on 423, which reveals that the pneumomediastinum has resolved.  She is here requesting authorization to finish the semester ended term tasks on line rather than try to ambulate through campus and I am agreeable to this as are her professors, allowing her to heal properly before returning to campus May 20 for the summer semester  The history is provided by the patient and a parent.    Past Medical History  Diagnosis Date  . Vitamin d deficiency   . Celiac disease   . Vitamin d deficiency     Past Surgical History  Procedure Date  . Tonsillectomy     History reviewed. No pertinent family history.  History  Substance Use Topics  . Smoking status: Never Smoker   . Smokeless tobacco: Not on file  . Alcohol Use: No    OB History    Grav Para Term Preterm Abortions TAB SAB Ect Mult Living                  Review of Systems  Constitutional: Negative for fever and chills.    Respiratory: Negative for shortness of breath.   Cardiovascular: Positive for chest pain. Negative for palpitations and leg swelling.  Skin: Negative for pallor.  Neurological: Negative for dizziness.    Allergies  Review of patient's allergies indicates no known allergies.  Home Medications   Current Outpatient Rx  Name Route Sig Dispense Refill  . BIOTIN PO Oral Take 1 tablet by mouth daily.    Marland Kitchen ENOXAPARIN SODIUM 150 MG/ML [place] SOLN Subcutaneous Inject 1 mg/kg into the skin 2 (two) times daily. Pt injecting 70 ml. BID    . LEVORA 0.15/30 (28) PO Oral Take by mouth.    . ADULT MULTIVITAMIN W/MINERALS CH Oral Take 1 tablet by mouth daily.    Marland Kitchen VITAMIN D (ERGOCALCIFEROL) 50000 UNITS PO CAPS Oral Take by mouth 3 (three) times a week. Pt taking  150,000 units  Every Monday, Wednesday, and Friday    . WARFARIN SODIUM 5 MG PO TABS Oral Take 5 mg by mouth daily.      BP 120/77  Pulse 86  Temp(Src) 98.6 F (37 C) (Oral)  Resp 18  SpO2 100%  LMP 12/15/2011  Physical Exam  Constitutional: She is oriented to person, place, and time. She appears well-developed and well-nourished.  HENT:  Head: Normocephalic.  Eyes: Pupils are equal, round, and reactive to light.  Neck: Normal range of motion.  Cardiovascular: Normal rate.   Pulmonary/Chest: Effort normal.  Musculoskeletal: Normal range of motion.       Positive distal pulses bilaterally feet, no change in color, sensation, temperature  Neurological: She is alert and oriented to person, place, and time.  Skin: Skin is warm and dry. No erythema.    ED Course  Procedures (including critical care time)  Labs Reviewed  PROTIME-INR - Abnormal; Notable for the following:    Prothrombin Time 31.3 (*)    INR 2.96 (*)    All other components within normal limits  POCT I-STAT, CHEM 8   Dg Chest 2 View  12/27/2011  *RADIOLOGY REPORT*  Clinical Data: 21 year old female with recent pneumomediastinum.  CHEST - 2 VIEW  Comparison: Chest  CTA 12/23/2011 and earlier.  Findings: No plain radiographic evidence of continued pneumomediastinum.  No subcutaneous gas identified in the neck. Normal lung volumes. Normal cardiac size and mediastinal contours. Visualized tracheal air column is within normal limits.  The lungs are clear.  No pneumothorax or effusion.  Stable mild scoliosis.  IMPRESSION: Negative, no acute cardiopulmonary abnormality.  No plain radiographic evidence of continued pneumomediastinum.  Clinically significant discrepancy from primary report, if provided: None  Original Report Authenticated By: Ulla Potash III, M.D.     1. Deep vein thrombosis, lower left extremity   2. Anxiety       MDM  Reviewed with patient, the results of today's INR, which is 2.96-therapeutic, as well as repeat ultrasound of her lower leg, which reveals that the clot has resolved.  I did review her chest CT and chest x-ray from the 19th and the 23rd discuss these results with patient and her mother also discussed having her complete the semester on line, which she is dates.  Her professors are willing to allow this if she has a physician's directive indicating this        Arman Filter, NP 12/28/11 2109  Arman Filter, NP 12/28/11 2109

## 2011-12-28 NOTE — ED Notes (Signed)
Vascular tech responded.

## 2011-12-28 NOTE — Discharge Instructions (Signed)
Deep Vein Thrombosis A deep vein thrombosis (DVT) is a blood clot (thrombus) that develops in a deep vein. A DVT is a clot in the deep, larger veins of the leg, arm, or pelvis. These are more dangerous than clots that might form in veins on the surface of the body. Deep vein thrombosis can lead to complications if the clot breaks off and travels in the bloodstream to the lungs. CAUSES Blood clots form in a vein for different reasons. Usually several things cause blood clots. They include:  The flow of blood slows down.   The inside of the vein is damaged in some way.   The person has a condition that makes blood clot more easily. These conditions may include:   Older age (especially over 75 years old).   Having a history of blood clots.   Having major or lengthy surgery. Hip surgery is particularly high-risk.   Breaking a hip or leg.   Sitting or lying still for a long time.   Cancer or cancer treatment.   Having a long, thin tube (catheter) placed inside a vein during a medical procedure.   Being overweight (obese).   Pregnancy and childbirth.   Medicines with estrogen.   Smoking.   Other circulation or heart problems.  SYMPTOMS When a clot forms, it can either partially or totally block the blood flow in that vein. Symptoms of a DVT can include:  Swelling of the leg or arm, especially if one side is much worse.   Warmth and redness of the leg or arm, especially if one side is much worse.   Pain in an arm or leg. If the clot is in the leg, symptoms may be more noticeable or worse when standing or walking.  If the blood clot travels to the lung, it may cause:  Shortness of breath.   Chest pain. The pain may be worsened by deep breaths.   Coughing up thick mucus (phlegm), possibly flecked with blood.  Anyone with these symptoms should get emergency medical treatment right away. Call your local emergency services (911 in [place] if you have these symptoms. DIAGNOSIS If  a DVT is suspected, your caregiver will take a full medical history. He or she will also perform a physical exam. Tests that also may be required include:  Studies of the clotting properties of the blood.   An ultrasound scan.   X-rays to show the flow of blood when special dye is injected into the veins (venography).   Studies of your lungs if you have any chest symptoms.  PREVENTION  Exercise the legs regularly. Take a brisk 30 minute walk every day.   Maintain a weight that is appropriate for your height.   Avoid sitting or lying in bed for long periods of time without moving your legs.   Women, particularly those over the age of 35, should consider the risks and benefits of taking estrogen medicines, including birth control pills.   Do not smoke, especially if you take estrogen medicines.   Long-distance travel can increase your risk. You should exercise your legs by walking or pumping the muscles every hour.   In hospital prevention:   Prevention may include medical and nonmedical measures.  TREATMENT  The most common treatment for DVT is blood thinning (anticoagulant) medicine, which reduces the blood's tendency to clot. Anticoagulants can stop new blood clots from forming and old ones from growing. They cannot dissolve existing clots. Your body does this by itself over time.   Anticoagulants can be given by mouth, by intravenous (IV) access, or by injection. Your caregiver will determine the best program for you.   Less commonly, clot-dissolving drugs (thrombolytics) are used to dissolve a DVT. They carry a high risk of bleeding, so they are used mainly in severe cases.   Very rarely, a blood clot in the leg needs to be removed surgically.   If you are unable to take anticoagulants, your caregiver may arrange for you to have a filter placed in a main vein in your belly (abdomen). This filter prevents clots from traveling to your lungs.  HOME CARE INSTRUCTIONS  Take all  medicines prescribed by your caregiver. Follow the directions carefully.   You will most likely continue taking anticoagulants after you leave the hospital. Your caregiver will advise you on the length of treatment (usually 3 to 6 months, sometimes for life).   Taking too much or too little of an anticoagulant is dangerous. While taking this type of medicine, you will need to have regular blood tests to be sure the dose is correct. The dose can change for many reasons. It is critically important that you take this medicine exactly as prescribed, and that you have blood tests exactly as directed.   Many foods can interfere with anticoagulants. These include foods high in vitamin K, such as spinach, kale, broccoli, cabbage, collard and turnip greens, Brussels sprouts, peas, cauliflower, seaweed, parsley, beef and pork liver, green tea, and soybean oil. Your caregiver should discuss limits on these foods with you or you should arrange a visit with a dietician to answer your questions.   Many medicines can interfere with anticoagulants. You must tell your caregiver about any and all medicines you take. This includes all vitamins and supplements. Be especially cautious with aspirin and anti-inflammatory medicines. Ask your caregiver before taking these.   Anticoagulants can have side effects, mostly excessive bruising or bleeding. You will need to hold pressure over cuts for longer than usual. Avoid alcoholic drinks or consume only very small amounts while taking this medicine.   If you are taking an anticoagulant:   Wear a medical alert bracelet.   Notify your dentist or other caregivers before procedures.   Avoid contact sports.   Ask your caregiver how soon you can go back to normal activities. Not being active can lead to new clots. Ask for a list of what you should and should not do.   Exercise your lower leg muscles. This is important while traveling.   You may need to wear compression  stockings. These are tight elastic stockings that apply pressure to the lower legs. This can help keep the blood in the legs from clotting.   If you are a smoker, you should quit.   Learn as much as you can about DVT.  SEEK MEDICAL CARE IF:  You have unusual bruising or any bleeding problems.   The swelling or pain in your affected arm or leg is not gradually improving.   You anticipate surgery or long-distance travel. You should get specific advice on DVT prevention.   You discover other family members with blood clots. This may require further testing for inherited diseases or conditions.  SEEK IMMEDIATE MEDICAL CARE IF:  You develop chest pain.   You develop severe shortness of breath.   You begin to cough up bloody mucus or phlegm (sputum).   You feel dizzy or faint.   You develop swelling or pain in the leg.   You have   breathing problems after traveling.  MAKE SURE YOU:  Understand these instructions.   Will watch your condition.   Will get help right away if you are not doing well or get worse.  Document Released: 08/22/2005 Document Revised: 08/11/2011 Document Reviewed: 10/14/2010 [facility] Patient Information 2012 [place], [place]. Today the  ultrasound of your leg is negative for visualized clot,  your INR is 2.96 which is therapeutic. As discussed, you should stop your birth control hormone replacement.

## 2011-12-28 NOTE — Telephone Encounter (Signed)
Dede called from [facility] to request OV notes/labs on pt who has been referred to their coumadin clinic. Faxed OV notes, PT/INR results, and phone message w/ pt instr's for coumadin dose to [phone]

## 2011-12-28 NOTE — Progress Notes (Signed)
*  PRELIMINARY RESULTS* Vascular Ultrasound Lower extremity venous duplex has been completed.  Preliminary findings: Bilaterally no evidence of DVT or baker's cyst.  Farrel Demark, RDMS 12/28/2011, 7:23 PM

## 2011-12-28 NOTE — ED Notes (Signed)
Vascular tech paged per switchboard.

## 2012-01-01 NOTE — ED Provider Notes (Signed)
Medical screening examination/treatment/procedure(s) were performed by non-physician practitioner and as supervising physician I was immediately available for consultation/collaboration.  Danica Camarena, MD 01/01/12 1711 

## 2012-01-27 ENCOUNTER — Ambulatory Visit (INDEPENDENT_AMBULATORY_CARE_PROVIDER_SITE_OTHER): Payer: PRIVATE HEALTH INSURANCE | Admitting: Family Medicine

## 2012-01-27 VITALS — BP 112/70 | HR 79 | Temp 98.8°F | Resp 16 | Ht 67.75 in | Wt 148.0 lb

## 2012-01-27 DIAGNOSIS — M79609 Pain in unspecified limb: Secondary | ICD-10-CM

## 2012-01-27 DIAGNOSIS — I749 Embolism and thrombosis of unspecified artery: Secondary | ICD-10-CM

## 2012-01-27 DIAGNOSIS — M79606 Pain in leg, unspecified: Secondary | ICD-10-CM

## 2012-01-27 DIAGNOSIS — M25579 Pain in unspecified ankle and joints of unspecified foot: Secondary | ICD-10-CM

## 2012-01-27 LAB — POCT CBC
Granulocyte percent: 37.3 %G (ref 37–80)
HCT, POC: 42 % (ref 37.7–47.9)
MCV: 88 fL (ref 80–97)
MID (cbc): 0.4 (ref 0–0.9)
POC Granulocyte: 1.6 — AB (ref 2–6.9)
POC LYMPH PERCENT: 54.5 %L — AB (ref 10–50)
Platelet Count, POC: 395 10*3/uL (ref 142–424)
RBC: 4.77 M/uL (ref 4.04–5.48)
RDW, POC: 14 %

## 2012-01-27 LAB — POCT SEDIMENTATION RATE: POCT SED RATE: 19 mm/hr (ref 0–22)

## 2012-01-27 LAB — CK: Total CK: 95 U/L (ref 7–177)

## 2012-01-27 MED ORDER — METHOCARBAMOL 750 MG PO TABS: 750.0000 mg | ORAL_TABLET | Freq: Four times a day (QID) | ORAL | Status: AC

## 2012-01-27 NOTE — Patient Instructions (Signed)
Continue the Coumadin  Try to wear symmetrical shoes, and make herself walk with a more regular gait. If the pain persists let me know and we will refer you for physical therapy.

## 2012-01-27 NOTE — Progress Notes (Signed)
Subjective: Since this patient was here last month with her DVT she has had her Coumadin regulated and the INR is running around 2. She continues to have a lot of pain down in the anterior thighs just above both knees. She still hurts in the left ankle and walks with a limp. She continues to wear the postop shoe on left foot because that is felt better to her. 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 She finds it just sitting there it just hurts very badly.  Objective Subjectively the patient feels like the there is swelling in the area just above both of her knees. 2 myalgias abrasion I do not note any significant degree of swelling there, and it is not edematous. There is no effusion in the knee joints. Calves are symmetrical. Negative Homans. She still has some tenderness in the left ankle. No edema.  Results for orders placed in visit on 01/27/12  POCT CBC      Component Value Range   WBC 4.3 (*) 4.6 - 10.2 (K/uL)   Lymph, poc 2.3  0.6 - 3.4    POC LYMPH PERCENT 54.5 (*) 10 - 50 (%L)   MID (cbc) 0.4  0 - 0.9    POC MID % 8.2  0 - 12 (%M)   POC Granulocyte 1.6 (*) 2 - 6.9    Granulocyte percent 37.3  37 - 80 (%G)   RBC 4.77  4.04 - 5.48 (M/uL)   Hemoglobin 13.4  12.2 - 16.2 (g/dL)   HCT, POC 11.9  14.7 - 47.9 (%)   MCV 88.0  80 - 97 (fL)   MCH, POC 28.1  27 - 31.2 (pg)   MCHC 31.9  31.8 - 35.4 (g/dL)   RDW, POC 82.9     Platelet Count, POC 395  142 - 424 (K/uL)   MPV 8.6  0 - 99.8 (fL)    Assessment: Bilateral lower thigh pain Recent DVT and pulmonary emboli Left ankle pain  Plan: Check CBC, sedimentation rate, and CPK on her. She apparently had a hiatal a lot of labs done by her physician in [place]. She had a positive ANA which we do not have a copy of. Her other labs including the CBC, TSH, DNA and Sjgren's panel, were all normal. She did have an elevated gliadin antibody I am uncertain how this would  relate to any such pains. I do not have the ANA report, though as an isolated elevation and is probably not of great concern. She also had followup ultrasound and an MRI of her ankle when she was there.  It is my impression that because she is still having ankle discomfort and is walking with the such a stiff gait that she is probably straining the muscles of her anterior thighs and causing pain. Sorting muscles may spasmed up some causes of swelling there. We will try a muscle relaxant at bedtime on her. Emerging her to get back to wearing regular sneakers and try to make herself walk with a more regular gait. We can order physical therapy on her if she is not doing better.  Cbc, cpk, esr today.

## 2012-01-28 ENCOUNTER — Telehealth: Payer: Self-pay

## 2012-01-28 NOTE — Telephone Encounter (Signed)
Pt was seen 01/27/12 calling to get labs results. Please call pt  Cell #

## 2012-01-29 ENCOUNTER — Encounter: Payer: Self-pay | Admitting: Family Medicine

## 2012-01-29 NOTE — Telephone Encounter (Signed)
Nothing of concern, Dr. Alwyn Ren has yet to review.

## 2012-01-30 NOTE — Telephone Encounter (Signed)
See lab letter-- patient notified info.

## 2012-02-06 ENCOUNTER — Ambulatory Visit (INDEPENDENT_AMBULATORY_CARE_PROVIDER_SITE_OTHER): Payer: PRIVATE HEALTH INSURANCE | Admitting: Internal Medicine

## 2012-02-06 VITALS — BP 98/65 | HR 128 | Temp 103.0°F | Resp 18 | Wt 146.6 lb

## 2012-02-06 DIAGNOSIS — K900 Celiac disease: Secondary | ICD-10-CM | POA: Insufficient documentation

## 2012-02-06 DIAGNOSIS — M1710 Unilateral primary osteoarthritis, unspecified knee: Secondary | ICD-10-CM | POA: Insufficient documentation

## 2012-02-06 DIAGNOSIS — R319 Hematuria, unspecified: Secondary | ICD-10-CM

## 2012-02-06 DIAGNOSIS — M25569 Pain in unspecified knee: Secondary | ICD-10-CM

## 2012-02-06 DIAGNOSIS — J039 Acute tonsillitis, unspecified: Secondary | ICD-10-CM

## 2012-02-06 DIAGNOSIS — I82409 Acute embolism and thrombosis of unspecified deep veins of unspecified lower extremity: Secondary | ICD-10-CM | POA: Insufficient documentation

## 2012-02-06 DIAGNOSIS — R509 Fever, unspecified: Secondary | ICD-10-CM

## 2012-02-06 DIAGNOSIS — I2699 Other pulmonary embolism without acute cor pulmonale: Secondary | ICD-10-CM

## 2012-02-06 LAB — POCT CBC
Granulocyte percent: 82.9 %G — AB (ref 37–80)
HCT, POC: 38.9 % (ref 37.7–47.9)
MCH, POC: 28.4 pg (ref 27–31.2)
MCV: 87.5 fL (ref 80–97)
RBC: 4.44 M/uL (ref 4.04–5.48)
WBC: 10.6 10*3/uL — AB (ref 4.6–10.2)

## 2012-02-06 LAB — POCT URINALYSIS DIPSTICK
Ketones, UA: 160
Protein, UA: 30
Spec Grav, UA: 1.02
pH, UA: 6

## 2012-02-06 LAB — POCT UA - MICROSCOPIC ONLY
Casts, Ur, LPF, POC: NEGATIVE
Crystals, Ur, HPF, POC: NEGATIVE
Mucus, UA: NEGATIVE

## 2012-02-06 MED ORDER — AMOXICILLIN 875 MG PO TABS: 875.0000 mg | ORAL_TABLET | Freq: Two times a day (BID) | ORAL | Status: AC

## 2012-02-06 MED ORDER — TRAMADOL HCL 50 MG PO TABS: 50.0000 mg | ORAL_TABLET | Freq: Four times a day (QID) | ORAL | Status: AC | PRN

## 2012-02-06 MED ORDER — ACETAMINOPHEN 500 MG PO TABS
1000.0000 mg | ORAL_TABLET | Freq: Once | ORAL | Status: AC
  Administered 2012-02-06: 1000 mg via ORAL

## 2012-02-06 MED ORDER — ACETAMINOPHEN 500 MG PO TABS: ORAL_TABLET | ORAL | Status: DC

## 2012-02-06 NOTE — Progress Notes (Signed)
Subjective:    Patient ID: Olivia Johns, female    DOB: Jul 26, 1991, 21 y.o.   MRN: [id number]  HPI24 hour history of fever and sore throat Denies nasal congestion, cough, nausea or vomiting, diarrhea, dysuria or frequency. Recently switched to Depo-Provera and is currently spotting. Has never been sexually active. Has had several recent visits. Bilateral knee pain and swelling right greater than left for about 8 weeks. Was told that her laboratory evaluation was negative for rheumatoid arthritis and lupus although the ANA was positive. Deep vein thrombosis with pulmonary embolus, now on Coumadin. Etiology uncertain. Was changed from oral contraceptives to Depo-Provera after this. She thinks she has had a hypercoagulability profile but I don't see one in her lab results. Today she has noticed chest discomfort bilaterally on the lower anterior ribs with deep inspiration, but no shortness of breath. No history of asthma  Past medical history-mono 6 grade/Celiac disease diagnosed with 2 positive endoscopies but negative serology/is not able to follow a gluten-free diet/Constipation has been main feature. Tonsillectomy.  Social history-[employer]. 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 Negative for diaphoresis, appetite change and unexpected weight change.       Has noted activity decreased due to knee pain/uses a cane now  HENT: Negative for ear pain, drooling, mouth sores, neck pain and neck stiffness.   Eyes: Negative for discharge.  Respiratory: Negative for shortness of breath.   Cardiovascular: Negative for palpitations.  Gastrointestinal: Negative for abdominal pain, diarrhea, blood in stool, abdominal distention and anal bleeding.  Genitourinary: Negative for dysuria, urgency, frequency, vaginal discharge and difficulty urinating.  Musculoskeletal:  Positive for back pain.       History of back pain for several months or years intermittently but never radicular symptoms or extremity numbness or weakness  Skin: Negative for rash.  Neurological: Negative for tremors, seizures, speech difficulty and numbness.       Occasional left-sided headaches with left neck pain but never associated with any other neurological phenomena  Hematological: Does not bruise/bleed easily.  Psychiatric/Behavioral: Negative for sleep disturbance. The patient is not nervous/anxious.        Objective:   Physical Exam In mild distress/shivering Given 2 Tylenol with temp 103, pulse 128 Alert and oriented Pupils equal round reactive to light and accommodation/conjunctiva clear Tympanic membranes clear Nares clear Oropharynx injected with exudate in the right posterior Tonsillar area where is there is no tonsillar tissue/Q-tip with purulent mucus No oral lesions Tender small a.c. Nodes/no pc Nodes or [place] nodes Chest clear Heart regular/pulse slows to 80 in supine position/no postural tachycardia No murmurs rubs gallops Lungs clear Abdomen soft nontender nondistended without organomegaly Skin is free of rashes The right knee has a mild effusion but no redness/ tenderness to Palpation over both joint lines/there is some restriction to flexion due to the effusion/No laxity to stressors The left knee has minimal effusion with no redness or tenderness and full range of motion without laxity Other major joints are clear No skin rash Neurological intact       Results for orders placed in visit on 02/06/12  POCT CBC      Component Value Range   WBC 10.6 (*) 4.6 - 10.2 (K/uL)   Lymph, poc 13.0 (*) 0.6 - 3.4    POC LYMPH PERCENT 12.5  10 - 50 (%L)   MID (cbc) 0.5  0 - 0.9  POC MID % 4.6  0 - 12 (%M)   POC Granulocyte 8.8 (*) 2 - 6.9    Granulocyte percent 82.9 (*) 37 - 80 (%G)   RBC 4.44  4.04 - 5.48 (M/uL)   Hemoglobin 12.6  12.2 - 16.2 (g/dL)   HCT,  POC 16.1  09.6 - 47.9 (%)   MCV 87.5  80 - 97 (fL)   MCH, POC 28.4  27 - 31.2 (pg)   MCHC 32.4  31.8 - 35.4 (g/dL)   RDW, POC 04.5     Platelet Count, POC 301  142 - 424 (K/uL)   MPV 8.5  0 - 99.8 (fL)  POCT UA - MICROSCOPIC ONLY      Component Value Range   WBC, Ur, HPF, POC 3-6     RBC, urine, microscopic tntc     Bacteria, U Microscopic trace     Mucus, UA neg     Epithelial cells, urine per micros 0-1     Crystals, Ur, HPF, POC neg     Casts, Ur, LPF, POC neg     Yeast, UA neg    POCT URINALYSIS DIPSTICK      Component Value Range   Color, UA yellow     Clarity, UA cloudy     Glucose, UA neg     Bilirubin, UA small     Ketones, UA 160     Spec Grav, UA 1.020     Blood, UA large     pH, UA 6.0     Protein, UA 30     Urobilinogen, UA 1.0     Nitrite, UA neg     Leukocytes, UA Negative    POCT RAPID STREP A (OFFICE)      Component Value Range   Rapid Strep A Screen Negative  Negative     Assessment & Plan:  Problem #1 fever Problem #2 acute pharyngitis With leukocytosis  Throat culture sent  Amoxicillin 875 twice a day for 10 days Problem #3 arthritis knees bilateral onset 6 weeks ago/unknown cause-With normal labs and a   normal sedimentation rate this is a seronegative arthritis or reactive joint disorder of some  sort/it is important that she has never been sexually active/I will set up an evaluation by   rheumatology Problem #4 celiac disease Problem #5 history of recent pulmonary embolus from DVT/ON Coumadin Problem #6 back pain of uncertain etiology Problem #7 hematuria probably secondary to vaginal spotting/check creatinine  Meds ordered this encounter  Medications  . Throat culture sent              . acetaminophen (TYLENOL) tablet 1,000 mg/ once in clinic      . amoxicillin (AMOXIL) 875 MG tablet    Sig: Take 1 tablet (875 mg total) by mouth 2 (two) times daily.    Dispense:  20 tablet    Refill:  0  . traMADol (ULTRAM) 50 MG tablet    Sig: Take 1  tablet (50 mg total) by mouth every 6 (six) hours as needed for pain-Knees    Dispense:  30 tablet    Refill:  0    -  Rheumatology referral   -  Metabolic profile  Recheck in 4 days/sooner if worse

## 2012-02-07 LAB — BASIC METABOLIC PANEL
BUN: 9 mg/dL (ref 6–23)
CO2: 22 mEq/L (ref 19–32)
Calcium: 9.5 mg/dL (ref 8.4–10.5)
Creat: 0.82 mg/dL (ref 0.50–1.10)
Glucose, Bld: 83 mg/dL (ref 70–99)
Sodium: 138 mEq/L (ref 135–145)

## 2012-02-07 NOTE — Progress Notes (Signed)
Addended by: Cira Rue R on: 02/07/2012 09:03 AM   Modules accepted: Orders

## 2012-02-08 LAB — SEDIMENTATION RATE: Sed Rate: 6 mm/hr (ref 0–22)

## 2012-02-11 ENCOUNTER — Telehealth: Payer: Self-pay

## 2012-02-11 NOTE — Telephone Encounter (Signed)
Pt is requesting her medical records. Please call when ready

## 2012-02-13 NOTE — Telephone Encounter (Signed)
Records printed for pickup. Patient notified. °

## 2012-04-24 ENCOUNTER — Encounter: Payer: Self-pay | Admitting: Family Medicine

## 2012-11-20 ENCOUNTER — Emergency Department (HOSPITAL_COMMUNITY)
Admission: EM | Admit: 2012-11-20 | Discharge: 2012-11-20 | Disposition: A | Discharge status: Home / Self Care | Payer: No Typology Code available for payment source | Attending: Emergency Medicine | Admitting: Emergency Medicine

## 2012-11-20 ENCOUNTER — Emergency Department (HOSPITAL_COMMUNITY): Payer: No Typology Code available for payment source

## 2012-11-20 ENCOUNTER — Encounter (HOSPITAL_COMMUNITY): Payer: Self-pay | Admitting: Cardiology

## 2012-11-20 DIAGNOSIS — Z3202 Encounter for pregnancy test, result negative: Secondary | ICD-10-CM | POA: Insufficient documentation

## 2012-11-20 DIAGNOSIS — Z79899 Other long term (current) drug therapy: Secondary | ICD-10-CM | POA: Insufficient documentation

## 2012-11-20 DIAGNOSIS — R209 Unspecified disturbances of skin sensation: Secondary | ICD-10-CM | POA: Insufficient documentation

## 2012-11-20 DIAGNOSIS — R0682 Tachypnea, not elsewhere classified: Secondary | ICD-10-CM | POA: Insufficient documentation

## 2012-11-20 DIAGNOSIS — Z86718 Personal history of other venous thrombosis and embolism: Secondary | ICD-10-CM | POA: Insufficient documentation

## 2012-11-20 DIAGNOSIS — R0602 Shortness of breath: Secondary | ICD-10-CM | POA: Insufficient documentation

## 2012-11-20 DIAGNOSIS — F411 Generalized anxiety disorder: Secondary | ICD-10-CM | POA: Insufficient documentation

## 2012-11-20 DIAGNOSIS — E559 Vitamin D deficiency, unspecified: Secondary | ICD-10-CM | POA: Insufficient documentation

## 2012-11-20 DIAGNOSIS — R000 Tachycardia, unspecified: Secondary | ICD-10-CM | POA: Insufficient documentation

## 2012-11-20 DIAGNOSIS — R002 Palpitations: Secondary | ICD-10-CM | POA: Insufficient documentation

## 2012-11-20 DIAGNOSIS — Z8739 Personal history of other diseases of the musculoskeletal system and connective tissue: Secondary | ICD-10-CM | POA: Insufficient documentation

## 2012-11-20 DIAGNOSIS — Z8719 Personal history of other diseases of the digestive system: Secondary | ICD-10-CM | POA: Insufficient documentation

## 2012-11-20 LAB — CBC
HCT: 43 % (ref 36.0–46.0)
Hemoglobin: 14.8 g/dL (ref 12.0–15.0)
MCHC: 34.4 g/dL (ref 30.0–36.0)
MCV: 88.1 fL (ref 78.0–100.0)

## 2012-11-20 LAB — POCT I-STAT, CHEM 8
Calcium, Ion: 1.27 mmol/L — ABNORMAL HIGH (ref 1.12–1.23)
Chloride: 102 mEq/L (ref 96–112)
HCT: 45 % (ref 36.0–46.0)
Potassium: 3.4 mEq/L — ABNORMAL LOW (ref 3.5–5.1)
Sodium: 141 mEq/L (ref 135–145)

## 2012-11-20 LAB — URINALYSIS, ROUTINE W REFLEX MICROSCOPIC
Glucose, UA: NEGATIVE mg/dL
Hgb urine dipstick: NEGATIVE
Protein, ur: NEGATIVE mg/dL
Specific Gravity, Urine: 1.037 — ABNORMAL HIGH (ref 1.005–1.030)

## 2012-11-20 LAB — POCT PREGNANCY, URINE: Preg Test, Ur: NEGATIVE

## 2012-11-20 MED ORDER — LABETALOL HCL 5 MG/ML IV SOLN
10.0000 mg | Freq: Once | INTRAVENOUS | Status: DC
  Administered 2012-11-20: 10 mg via INTRAVENOUS
  Filled 2012-11-20: qty 4

## 2012-11-20 MED ORDER — METOPROLOL TARTRATE 1 MG/ML IV SOLN: 5.0000 mg | Freq: Once | INTRAVENOUS | Status: DC

## 2012-11-20 MED ORDER — METOPROLOL TARTRATE 50 MG PO TABS: 25.0000 mg | ORAL_TABLET | Freq: Every day | ORAL | Status: DC

## 2012-11-20 MED ORDER — IOHEXOL 350 MG/ML SOLN
100.0000 mL | Freq: Once | INTRAVENOUS | Status: AC | PRN
  Administered 2012-11-20: 100 mL via INTRAVENOUS

## 2012-11-20 NOTE — ED Notes (Signed)
PA Mathis Fare, Dc'd med as it was being administered and ordered another,  Informed her that this had occurred. New med   discontinued .

## 2012-11-20 NOTE — ED Provider Notes (Signed)
11:12 AM  Date: 11/20/2012  Rate: 115  Rhythm: sinus tachycardia  QRS Axis: right  Intervals: normal  ST/T Wave abnormalities: normal  Conduction Disutrbances:none  Narrative Interpretation: Sinus tachycardia  Old EKG Reviewed: changes noted--rate more rapid than on 12/23/2011.    Carleene Cooper III, MD 11/20/12 1537

## 2012-11-20 NOTE — ED Notes (Signed)
Pt taken for CT scan at this time

## 2012-11-20 NOTE — ED Notes (Signed)
Pt reports chest pain that started yesterday with some SOB. 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 States she has not seen a cardiologist yet.

## 2012-11-20 NOTE — Progress Notes (Signed)
2:03 PM  Date: 11/20/2012  Rate:132  Rhythm: sinus tachycardia  QRS Axis: normal  Intervals: normal  ST/T Wave abnormalities: normal  Conduction Disutrbances:none  Narrative Interpretation: Sinus tachycardia   Old EKG Reviewed: changes noted--Rate slightly faster than at 10:54 A.M.

## 2012-11-20 NOTE — ED Notes (Signed)
Called into room by pt, sitting upright on stretcher, stating, " I feel as if my heart is pounding out my chest" HR noted to be   137 - ekg performed. Pt noted to be breathing rapidly , co feeling light headed, reassurance given , encouraged to slow down breathing, denied any increase in cp, just that she couldn't catch her breath. HR noted to decrease back down to 90's at this time . Pt stated that she was just sitting there watching TV when this came on .

## 2012-11-20 NOTE — ED Notes (Signed)
Dr Mayford Knife reviewed EKG and informed of pt's verbalization

## 2012-11-20 NOTE — ED Notes (Signed)
Call placed to CT re- time until they come to get pt, informed that there is 1 patient ahead of her. Pt informed

## 2012-11-20 NOTE — ED Provider Notes (Signed)
History     CSN: [id number]  Arrival date & time 11/20/12  1048   First MD Initiated Contact with Patient 11/20/12 1050      Chief Complaint  Patient presents with  . Chest Pain    (Consider location/radiation/quality/duration/timing/severity/associated sxs/prior treatment) HPI Comments: 22 year old female with a past medical history of undifferentiated connective tissue disease and DVT presents emergency department complaining of shortness of breath and palpitations x2 days. Patient states she was laying down yesterday when she began to feel short of breath. At that time she had associated sharp chest pains and palpitations. Chest pain worse with taking a deep breath in. Her episode of shortness of breath lasted about 4 hours. Shortness of breath has been coming and going since. Patient haa history of a DVT diagnosed April of 2013, she finished her Coumadin treatment in November 2013. She is on prednisone and Plaquinel for her connective tissue disease. Currently denies chest pain, leg swelling, nausea, vomiting, lightheadedness or dizziness. Denies any urinary or vaginal symptoms.  Patient is a 22 y.o. female presenting with chest pain. The history is provided by the patient.  Chest Pain Associated symptoms: palpitations and shortness of breath   Associated symptoms: no dizziness, no nausea and not vomiting     Past Medical History  Diagnosis Date  . Vitamin D deficiency   . Celiac disease   . Vitamin D deficiency     Past Surgical History  Procedure Laterality Date  . Tonsillectomy      History reviewed. No pertinent family history.  History  Substance Use Topics  . Smoking status: Never Smoker   . Smokeless tobacco: Not on file  . Alcohol Use: No    OB History   Grav Para Term Preterm Abortions TAB SAB Ect Mult Living                  Review of Systems  Respiratory: Positive for shortness of breath.   Cardiovascular: Positive for chest pain and palpitations.  Negative for leg swelling.  Gastrointestinal: Negative for nausea and vomiting.  Neurological: Negative for dizziness and light-headedness.  All other systems reviewed and are negative.    Allergies  Gluten meal  Home Medications   Current Outpatient Rx  Name  Route  Sig  Dispense  Refill  . BIOTIN PO   Oral   Take 1 tablet by mouth daily.         . medroxyPROGESTERone (DEPO-PROVERA) 150 MG/ML injection   Intramuscular   Inject 150 mg into the muscle every 3 (three) months.         . Multiple Vitamin (MULITIVITAMIN WITH MINERALS) TABS   Oral   Take 1 tablet by mouth daily.         . Vitamin D, Ergocalciferol, (DRISDOL) 50000 UNITS CAPS   Oral   Take by mouth 3 (three) times a week. Pt taking  150,000 units  Every Monday, Wednesday, and Friday           BP 128/75  Pulse 118  Temp(Src) 98.7 F (37.1 C) (Oral)  SpO2 98%  Physical Exam  Nursing note and vitals reviewed. Constitutional: She is oriented to person, place, and time. She appears well-developed and well-nourished.  Labored breathing  HENT:  Head: Normocephalic and atraumatic.  Mouth/Throat: Oropharynx is clear and moist.  Eyes: Conjunctivae and EOM are normal. Pupils are equal, round, and reactive to light.  Neck: Normal range of motion. Neck supple. No JVD present. No tracheal deviation present.  Cardiovascular: Regular rhythm, normal heart sounds and intact distal pulses.  Tachycardia present.   No extremity edema, no calf tenderness, calf size  equal bilateral.  Pulmonary/Chest: Breath sounds normal. Tachypnea noted. No respiratory distress. She has no decreased breath sounds. She has no wheezes. She has no rhonchi. She has no rales.  Abdominal: Soft. Bowel sounds are normal. There is no tenderness.  Musculoskeletal: Normal range of motion. She exhibits no edema.  Neurological: She is alert and oriented to person, place, and time.  Skin: Skin is warm and dry. She is not diaphoretic.   Psychiatric: Her speech is normal and behavior is normal. Her mood appears anxious.    ED Course  Procedures (including critical care time)  Labs Reviewed - No data to display No results found.   1. Shortness of breath   2. Tachycardia       MDM  SOB, pleuritic CP and palpitations with hx of DVT. Obtaining CT angio due to concern for PE.  3:15 PM CT negative for PE. Patient still tachy. Gave labatalol in ED which decreased HR to 75 from 118. BP stable. She is feeling more comfortable. CBC, istat chem 8 WNL. Waiting for UA and urine pregnancy. Pending those negative, patient will be discharged with metoprolol 25 mg qd and advised to f/u with her PCP Dr. Alwyn Ren. Dr. Ignacia Palma also involved in care of patient and will await urine results.      Trevor Mace, PA-C 11/20/12 1540

## 2012-11-20 NOTE — ED Provider Notes (Signed)
Medical screening examination/treatment/procedure(s) were conducted as a shared visit with non-physician practitioner(s) and myself.  I personally evaluated the patient during the encounter 22 yo woman with palpitations and shortness of breath, prior Hx DVT last year.  Exam shows lungs clear, mild tachycardia, no calf tenderness or Homans' sign.  CT of chest negative for PE, lab workup negative.  Rx for palpitations with metoprolol, F/U at [facility], where her PCP works.  Carleene Cooper III, MD 11/20/12 [phone]

## 2012-11-20 NOTE — ED Notes (Signed)
Pt back from CT- reconnected to monitor, pleasant and quiet, stated no change in pain as of this time.   Side rails up , bed low and locked, pt was given call light by transport person upon return to room.

## 2013-10-11 ENCOUNTER — Emergency Department (HOSPITAL_COMMUNITY)
Admission: EM | Admit: 2013-10-11 | Discharge: 2013-10-11 | Disposition: A | Discharge status: Home / Self Care | Payer: No Typology Code available for payment source | Attending: Emergency Medicine | Admitting: Emergency Medicine

## 2013-10-11 ENCOUNTER — Emergency Department (HOSPITAL_COMMUNITY): Payer: No Typology Code available for payment source

## 2013-10-11 ENCOUNTER — Encounter (HOSPITAL_COMMUNITY): Payer: Self-pay | Admitting: Emergency Medicine

## 2013-10-11 DIAGNOSIS — R000 Tachycardia, unspecified: Secondary | ICD-10-CM | POA: Insufficient documentation

## 2013-10-11 DIAGNOSIS — E559 Vitamin D deficiency, unspecified: Secondary | ICD-10-CM | POA: Insufficient documentation

## 2013-10-11 DIAGNOSIS — Z79899 Other long term (current) drug therapy: Secondary | ICD-10-CM | POA: Insufficient documentation

## 2013-10-11 DIAGNOSIS — R109 Unspecified abdominal pain: Secondary | ICD-10-CM

## 2013-10-11 DIAGNOSIS — N939 Abnormal uterine and vaginal bleeding, unspecified: Secondary | ICD-10-CM

## 2013-10-11 DIAGNOSIS — Z8719 Personal history of other diseases of the digestive system: Secondary | ICD-10-CM | POA: Insufficient documentation

## 2013-10-11 DIAGNOSIS — R300 Dysuria: Secondary | ICD-10-CM | POA: Insufficient documentation

## 2013-10-11 DIAGNOSIS — Z3202 Encounter for pregnancy test, result negative: Secondary | ICD-10-CM | POA: Insufficient documentation

## 2013-10-11 DIAGNOSIS — Z792 Long term (current) use of antibiotics: Secondary | ICD-10-CM | POA: Insufficient documentation

## 2013-10-11 DIAGNOSIS — N898 Other specified noninflammatory disorders of vagina: Secondary | ICD-10-CM | POA: Insufficient documentation

## 2013-10-11 DIAGNOSIS — N949 Unspecified condition associated with female genital organs and menstrual cycle: Secondary | ICD-10-CM | POA: Insufficient documentation

## 2013-10-11 DIAGNOSIS — IMO0002 Reserved for concepts with insufficient information to code with codable children: Secondary | ICD-10-CM | POA: Insufficient documentation

## 2013-10-11 LAB — CBC WITH DIFFERENTIAL/PLATELET
BASOS ABS: 0 10*3/uL (ref 0.0–0.1)
Basophils Relative: 0 % (ref 0–1)
EOS PCT: 2 % (ref 0–5)
Eosinophils Absolute: 0.1 10*3/uL (ref 0.0–0.7)
HEMATOCRIT: 37.5 % (ref 36.0–46.0)
HEMOGLOBIN: 12.7 g/dL (ref 12.0–15.0)
LYMPHS PCT: 42 % (ref 12–46)
Lymphs Abs: 2.2 10*3/uL (ref 0.7–4.0)
MCH: 30.8 pg (ref 26.0–34.0)
MCHC: 33.9 g/dL (ref 30.0–36.0)
MCV: 91 fL (ref 78.0–100.0)
MONO ABS: 0.5 10*3/uL (ref 0.1–1.0)
MONOS PCT: 9 % (ref 3–12)
Neutro Abs: 2.4 10*3/uL (ref 1.7–7.7)
Neutrophils Relative %: 46 % (ref 43–77)
Platelets: 278 10*3/uL (ref 150–400)
RBC: 4.12 MIL/uL (ref 3.87–5.11)
RDW: 14.6 % (ref 11.5–15.5)
WBC: 5.3 10*3/uL (ref 4.0–10.5)

## 2013-10-11 LAB — URINE MICROSCOPIC-ADD ON

## 2013-10-11 LAB — URINALYSIS, ROUTINE W REFLEX MICROSCOPIC
BILIRUBIN URINE: NEGATIVE
Glucose, UA: NEGATIVE mg/dL
KETONES UR: NEGATIVE mg/dL
Leukocytes, UA: NEGATIVE
NITRITE: NEGATIVE
PH: 6 (ref 5.0–8.0)
Protein, ur: NEGATIVE mg/dL
SPECIFIC GRAVITY, URINE: 1.017 (ref 1.005–1.030)
Urobilinogen, UA: 0.2 mg/dL (ref 0.0–1.0)

## 2013-10-11 LAB — PREGNANCY, URINE: Preg Test, Ur: NEGATIVE

## 2013-10-11 LAB — WET PREP, GENITAL
Clue Cells Wet Prep HPF POC: NONE SEEN
TRICH WET PREP: NONE SEEN
YEAST WET PREP: NONE SEEN

## 2013-10-11 MED ORDER — IBUPROFEN 800 MG PO TABS
800.0000 mg | ORAL_TABLET | Freq: Once | ORAL | Status: AC
  Administered 2013-10-11: 800 mg via ORAL
  Filled 2013-10-11: qty 1

## 2013-10-11 NOTE — Discharge Instructions (Signed)
Take ibuprofen every 6 hrs and tylenol every 4 hrs for pain.  If you were given medicines take as directed.  If you are on coumadin or contraceptives realize their levels and effectiveness is altered by many different medicines.  If you have any reaction (rash, tongues swelling, other) to the medicines stop taking and see a physician.   Please follow up as directed and return to the ER or see a physician for new or worsening symptoms.  Thank you.  Abnormal Uterine Bleeding Abnormal uterine bleeding means bleeding from the vagina that is not your normal menstrual period. This can be:  Bleeding or spotting between periods.  Bleeding after sex (sexual intercourse).  Bleeding that is heavier or more than normal.  Periods that last longer than usual.  Bleeding after menopause. There are many problems that may cause this. Treatment will depend on the cause of the bleeding. Any kind of bleeding that is not normal should be reviewed by your doctor.  HOME CARE Watch your condition for any changes. These actions may lessen any discomfort you are having:  Do not use tampons or douches as told by your doctor.  Change your pads often. You should get regular pelvic exams and Pap tests. Keep all appointments for tests as told by your doctor. GET HELP IF:  You are bleeding for more than 1 week.  You feel dizzy at times. GET HELP RIGHT AWAY IF:   You pass out.  You have to change pads every 15 to 30 minutes.  You have belly pain.  You have a fever.  You become sweaty or weak.  You are passing large blood clots from the vagina.  You feel sick to your stomach (nauseous) and throw up (vomit). MAKE SURE YOU:  Understand these instructions.  Will watch your condition.  Will get help right away if you are not doing well or get worse. Document Released: 06/19/2009 Document Revised: 06/12/2013 Document Reviewed: 03/21/2013 [facility] Patient Information 2014 [employer], [place].

## 2013-10-11 NOTE — ED Notes (Signed)
Pt in c/o vaginal bleeding x1 month, states it varies in heaviness, pt is on depo for birth control, sent here by PMD. Pt states she has been feeling more tired recently, c/o left suprapubic abd pain

## 2013-10-11 NOTE — ED Provider Notes (Signed)
CSN: [id number]     Arrival date & time 10/11/13  1047 History   First MD Initiated Contact with Patient 10/11/13 1121     Chief Complaint  Patient presents with  . Vaginal Bleeding   (Consider location/radiation/quality/duration/timing/severity/associated sxs/prior Treatment) HPI Comments: 23 yo female on birth control, not currently pregnant presents with vaginal bleeding intermittent for one month, no hx of similar. Left pelvic ache and suprapubic pain, mild dysuria.  No flank pain, fevers, new sexual partners, std hx or discharge. Pt has obgyn to fup with.  Patient is a 23 y.o. female presenting with vaginal bleeding. The history is provided by the patient.  Vaginal Bleeding Associated symptoms: dysuria   Associated symptoms: no abdominal pain, no back pain and no fever     Past Medical History  Diagnosis Date  . Vitamin D deficiency   . Celiac disease   . Vitamin D deficiency    Past Surgical History  Procedure Laterality Date  . Tonsillectomy     History reviewed. No pertinent family history. History  Substance Use Topics  . Smoking status: Never Smoker   . Smokeless tobacco: Not on file  . Alcohol Use: No   OB History   Grav Para Term Preterm Abortions TAB SAB Ect Mult Living                 Review of Systems  Constitutional: Positive for appetite change. Negative for fever and chills.  Respiratory: Negative for shortness of breath.   Cardiovascular: Negative for chest pain.  Gastrointestinal: Negative for vomiting and abdominal pain.  Genitourinary: Positive for dysuria, vaginal bleeding and pelvic pain. Negative for flank pain.  Musculoskeletal: Negative for back pain, neck pain and neck stiffness.  Skin: Negative for rash.  Neurological: Negative for light-headedness and headaches.    Allergies  Gluten meal  Home Medications   Current Outpatient Rx  Name  Route  Sig  Dispense  Refill  . BIOTIN PO   Oral   Take 1 tablet by mouth daily.         .  cholecalciferol (VITAMIN D) 1000 UNITS tablet   Oral   Take 1,000 Units by mouth daily.         Marland Kitchen diltiazem (CARDIZEM) 30 MG tablet   Oral   Take 30 mg by mouth 3 (three) times daily.         Marland Kitchen doxycycline (DORYX) 100 MG EC tablet   Oral   Take 100 mg by mouth 2 (two) times daily.         . DULoxetine (CYMBALTA) 20 MG capsule   Oral   Take 20 mg by mouth 2 (two) times daily.         . hydroxychloroquine (PLAQUENIL) 200 MG tablet   Oral   Take 200 mg by mouth 2 (two) times daily.          . hydrOXYzine (ATARAX/VISTARIL) 25 MG tablet   Oral   Take 25 mg by mouth 3 (three) times daily as needed for itching.         . medroxyPROGESTERone (DEPO-PROVERA) 150 MG/ML injection   Intramuscular   Inject 150 mg into the muscle every 3 (three) months.         . Multiple Vitamin (MULITIVITAMIN WITH MINERALS) TABS   Oral   Take 1 tablet by mouth daily.         . Omega-3 Fatty Acids (FISH OIL) 1000 MG CAPS   Oral   Take 1  capsule by mouth daily.         . predniSONE (DELTASONE) 10 MG tablet   Oral   Take 60 mg by mouth daily.          . Probiotic Product (PROBIOTIC DAILY PO)   Oral   Take 1 capsule by mouth 2 (two) times daily.         . vitamin B-12 (CYANOCOBALAMIN) 1000 MCG tablet   Oral   Take 1,000 mcg by mouth daily.          BP 121/74  Pulse 107  Temp(Src) 98 F (36.7 C) (Oral)  Resp 20  Wt 145 lb (65.772 kg)  SpO2 100% Physical Exam  Nursing note and vitals reviewed. Constitutional: She is oriented to person, place, and time. She appears well-developed and well-nourished.  HENT:  Head: Normocephalic and atraumatic.  Eyes: Right eye exhibits no discharge. Left eye exhibits no discharge.  Neck: Neck supple. No tracheal deviation present.  Cardiovascular: Regular rhythm.  Tachycardia present.   Pulmonary/Chest: Effort normal and breath sounds normal.  Abdominal: Soft. She exhibits no distension. There is tenderness (left pelvic). There is  no guarding.  Genitourinary:  Mild bleeding, no cmt tender, no discharge, mild fullness/ pain left adnexal  Musculoskeletal: She exhibits no edema.  Neurological: She is alert and oriented to person, place, and time.  Skin: Skin is warm. No rash noted.  Psychiatric: She has a normal mood and affect.    ED Course  Procedures (including critical care time) Labs Review Labs Reviewed  URINALYSIS, ROUTINE W REFLEX MICROSCOPIC - Abnormal; Notable for the following:    APPearance CLOUDY (*)    Hgb urine dipstick LARGE (*)    All other components within normal limits  URINE MICROSCOPIC-ADD ON - Abnormal; Notable for the following:    Bacteria, UA FEW (*)    All other components within normal limits  CBC WITH DIFFERENTIAL  PREGNANCY, URINE   Imaging Review US Transvaginal Non-ob  10/11/2013   CLINICAL DATA:  Vaginal bleeding and left-sided pelvic discomfort  EXAM: TRANSABDOMINAL AND TRANSVAGINAL ULTRASOUND OF PELVIS  DOPPLER ULTRASOUND OF OVARIES  TECHNIQUE: Both transabdominal and transvaginal ultrasound examinations of the pelvis were performed. Transabdominal technique was performed for global imaging of the pelvis including uterus, ovaries, adnexal regions, and pelvic cul-de-sac.  It was necessary to proceed with endovaginal exam following the transabdominal exam to visualize the endometrium and adnexal structures. Color and duplex Doppler ultrasound was utilized to evaluate blood flow to the ovaries.  COMPARISON:  None.  FINDINGS: Uterus  Measurements: 5.4 x 2.2 x 4.1 cm. No fibroids or other mass visualized.  Endometrium  Thickness: 4.1 mm. There is fluid containing debris within the endometrial cavity.  Right ovary  Measurements: 2.7 x 1.7 x 1.9 cm. There is a 0.9 cm diameter cyst present as well as developing follicles.  Left ovary  Measurements: 2.8 x 1.3 x 2.4 cm. There is a 0.8 cm maximal diameter cyst.  Pulsed Doppler evaluation of both ovaries demonstrates normal low-resistance arterial  and venous waveforms.  Other findings  No free pelvic fluid is demonstrated.  IMPRESSION: 1. The ovaries are normal in echotexture and contour and vascularity. 2. The endometrium does not appear abnormally thickened but there is a complex appearance of fluid within the endometrial cavity which may reflect blood products. The uterine corpus is normal in echotexture.   Electronically Signed   By: David  [place]   On: 10/11/2013 14:22   US Pelvis Complete  10/11/2013   CLINICAL DATA:  Vaginal bleeding and left-sided pelvic discomfort  EXAM: TRANSABDOMINAL AND TRANSVAGINAL ULTRASOUND OF PELVIS  DOPPLER ULTRASOUND OF OVARIES  TECHNIQUE: Both transabdominal and transvaginal ultrasound examinations of the pelvis were performed. Transabdominal technique was performed for global imaging of the pelvis including uterus, ovaries, adnexal regions, and pelvic cul-de-sac.  It was necessary to proceed with endovaginal exam following the transabdominal exam to visualize the endometrium and adnexal structures. Color and duplex Doppler ultrasound was utilized to evaluate blood flow to the ovaries.  COMPARISON:  None.  FINDINGS: Uterus  Measurements: 5.4 x 2.2 x 4.1 cm. No fibroids or other mass visualized.  Endometrium  Thickness: 4.1 mm. There is fluid containing debris within the endometrial cavity.  Right ovary  Measurements: 2.7 x 1.7 x 1.9 cm. There is a 0.9 cm diameter cyst present as well as developing follicles.  Left ovary  Measurements: 2.8 x 1.3 x 2.4 cm. There is a 0.8 cm maximal diameter cyst.  Pulsed Doppler evaluation of both ovaries demonstrates normal low-resistance arterial and venous waveforms.  Other findings  No free pelvic fluid is demonstrated.  IMPRESSION: 1. The ovaries are normal in echotexture and contour and vascularity. 2. The endometrium does not appear abnormally thickened but there is a complex appearance of fluid within the endometrial cavity which may reflect blood products. The uterine corpus is  normal in echotexture.   Electronically Signed   By: David  [place]   On: 10/11/2013 14:22   [place] Art/ven Flow Abd Pelv Doppler  10/11/2013   CLINICAL DATA:  Vaginal bleeding and left-sided pelvic discomfort  EXAM: TRANSABDOMINAL AND TRANSVAGINAL ULTRASOUND OF PELVIS  DOPPLER ULTRASOUND OF OVARIES  TECHNIQUE: Both transabdominal and transvaginal ultrasound examinations of the pelvis were performed. Transabdominal technique was performed for global imaging of the pelvis including uterus, ovaries, adnexal regions, and pelvic cul-de-sac.  It was necessary to proceed with endovaginal exam following the transabdominal exam to visualize the endometrium and adnexal structures. Color and duplex Doppler ultrasound was utilized to evaluate blood flow to the ovaries.  COMPARISON:  None.  FINDINGS: Uterus  Measurements: 5.4 x 2.2 x 4.1 cm. No fibroids or other mass visualized.  Endometrium  Thickness: 4.1 mm. There is fluid containing debris within the endometrial cavity.  Right ovary  Measurements: 2.7 x 1.7 x 1.9 cm. There is a 0.9 cm diameter cyst present as well as developing follicles.  Left ovary  Measurements: 2.8 x 1.3 x 2.4 cm. There is a 0.8 cm maximal diameter cyst.  Pulsed Doppler evaluation of both ovaries demonstrates normal low-resistance arterial and venous waveforms.  Other findings  No free pelvic fluid is demonstrated.  IMPRESSION: 1. The ovaries are normal in echotexture and contour and vascularity. 2. The endometrium does not appear abnormally thickened but there is a complex appearance of fluid within the endometrial cavity which may reflect blood products. The uterine corpus is normal in echotexture.   Electronically Signed   By: David  [place]   On: 10/11/2013 14:22    EKG Interpretation   None       MDM   1. Vaginal bleeding   2. Abdominal cramping    Well appearing. Focal pelvic pain. With neg preg concern for UTI vs cyst vs less likely intermittent torsion vs less likely STD. Pt has  close fup outpt, US ordered. Pain meds. Pt improved in ED. Bleeding minimal. US no acute findings.  Fup with women's/ pt OB.  Results and differential diagnosis were discussed with  the patient. Close follow up outpatient was discussed, patient comfortable with the plan.       Enid Skeens, MD 10/11/13 1534

## 2013-10-11 NOTE — ED Notes (Signed)
MD at bedside. 

## 2013-10-12 LAB — URINE CULTURE
COLONY COUNT: NO GROWTH
CULTURE: NO GROWTH

## 2013-10-14 LAB — GC/CHLAMYDIA PROBE AMP
CT PROBE, AMP APTIMA: NEGATIVE
GC Probe RNA: NEGATIVE

## 2014-01-10 ENCOUNTER — Encounter (HOSPITAL_COMMUNITY): Payer: Self-pay | Admitting: Emergency Medicine

## 2014-01-10 ENCOUNTER — Emergency Department (HOSPITAL_COMMUNITY)
Admission: EM | Admit: 2014-01-10 | Discharge: 2014-01-10 | Disposition: A | Discharge status: Home / Self Care | Payer: No Typology Code available for payment source | Attending: Emergency Medicine | Admitting: Emergency Medicine

## 2014-01-10 ENCOUNTER — Emergency Department (HOSPITAL_COMMUNITY): Payer: No Typology Code available for payment source

## 2014-01-10 DIAGNOSIS — R5383 Other fatigue: Secondary | ICD-10-CM

## 2014-01-10 DIAGNOSIS — Z79899 Other long term (current) drug therapy: Secondary | ICD-10-CM | POA: Insufficient documentation

## 2014-01-10 DIAGNOSIS — R5381 Other malaise: Secondary | ICD-10-CM | POA: Insufficient documentation

## 2014-01-10 DIAGNOSIS — I498 Other specified cardiac arrhythmias: Secondary | ICD-10-CM | POA: Insufficient documentation

## 2014-01-10 DIAGNOSIS — Z86711 Personal history of pulmonary embolism: Secondary | ICD-10-CM | POA: Insufficient documentation

## 2014-01-10 DIAGNOSIS — Z3202 Encounter for pregnancy test, result negative: Secondary | ICD-10-CM | POA: Insufficient documentation

## 2014-01-10 DIAGNOSIS — R42 Dizziness and giddiness: Secondary | ICD-10-CM | POA: Insufficient documentation

## 2014-01-10 DIAGNOSIS — E559 Vitamin D deficiency, unspecified: Secondary | ICD-10-CM | POA: Insufficient documentation

## 2014-01-10 DIAGNOSIS — M329 Systemic lupus erythematosus, unspecified: Secondary | ICD-10-CM | POA: Insufficient documentation

## 2014-01-10 DIAGNOSIS — R071 Chest pain on breathing: Secondary | ICD-10-CM | POA: Insufficient documentation

## 2014-01-10 DIAGNOSIS — R0789 Other chest pain: Secondary | ICD-10-CM

## 2014-01-10 DIAGNOSIS — Z8719 Personal history of other diseases of the digestive system: Secondary | ICD-10-CM | POA: Insufficient documentation

## 2014-01-10 LAB — URINALYSIS, ROUTINE W REFLEX MICROSCOPIC
BILIRUBIN URINE: NEGATIVE
Glucose, UA: NEGATIVE mg/dL
Hgb urine dipstick: NEGATIVE
KETONES UR: NEGATIVE mg/dL
NITRITE: NEGATIVE
PH: 6.5 (ref 5.0–8.0)
PROTEIN: NEGATIVE mg/dL
Specific Gravity, Urine: 1.018 (ref 1.005–1.030)
UROBILINOGEN UA: 1 mg/dL (ref 0.0–1.0)

## 2014-01-10 LAB — I-STAT CHEM 8, ED
BUN: 8 mg/dL (ref 6–23)
CHLORIDE: 101 meq/L (ref 96–112)
Calcium, Ion: 1.34 mmol/L — ABNORMAL HIGH (ref 1.12–1.23)
Creatinine, Ser: 0.8 mg/dL (ref 0.50–1.10)
Glucose, Bld: 85 mg/dL (ref 70–99)
HEMATOCRIT: 43 % (ref 36.0–46.0)
HEMOGLOBIN: 14.6 g/dL (ref 12.0–15.0)
Potassium: 3.5 mEq/L — ABNORMAL LOW (ref 3.7–5.3)
Sodium: 141 mEq/L (ref 137–147)
TCO2: 26 mmol/L (ref 0–100)

## 2014-01-10 LAB — CBC
HCT: 40.1 % (ref 36.0–46.0)
Hemoglobin: 13.4 g/dL (ref 12.0–15.0)
MCH: 29.7 pg (ref 26.0–34.0)
MCHC: 33.4 g/dL (ref 30.0–36.0)
MCV: 88.9 fL (ref 78.0–100.0)
PLATELETS: 281 10*3/uL (ref 150–400)
RBC: 4.51 MIL/uL (ref 3.87–5.11)
RDW: 12.3 % (ref 11.5–15.5)
WBC: 5.7 10*3/uL (ref 4.0–10.5)

## 2014-01-10 LAB — I-STAT TROPONIN, ED: TROPONIN I, POC: 0 ng/mL (ref 0.00–0.08)

## 2014-01-10 LAB — PREGNANCY, URINE: Preg Test, Ur: NEGATIVE

## 2014-01-10 LAB — URINE MICROSCOPIC-ADD ON

## 2014-01-10 LAB — D-DIMER, QUANTITATIVE (NOT AT ARMC)

## 2014-01-10 MED ORDER — SODIUM CHLORIDE 0.9 % IV BOLUS (SEPSIS)
1000.0000 mL | Freq: Once | INTRAVENOUS | Status: AC
  Administered 2014-01-10: 1000 mL via INTRAVENOUS

## 2014-01-10 MED ORDER — IBUPROFEN 800 MG PO TABS: 800.0000 mg | ORAL_TABLET | Freq: Three times a day (TID) | ORAL | Status: AC

## 2014-01-10 NOTE — Discharge Instructions (Signed)
Take ibuprofen with food, up to 800mg  three times a day, as needed for pain.  Apply a heating pad or ice pack to your chest and avoid activities that aggravate pain.  Follow up with your rheumatologist.  Return to the ER if chest pain worsens or becomes associated w/ fever or difficulty breathing.

## 2014-01-10 NOTE — ED Notes (Addendum)
Pt in c/o chest pain and dizziness since this morning, symptoms are worse when going from sitting to standing, denies n/v or other symptoms

## 2014-01-10 NOTE — ED Provider Notes (Signed)
Medical screening examination/treatment/procedure(s) were performed by non-physician practitioner and as supervising physician I was immediately available for consultation/collaboration.   EKG Interpretation   Date/Time:  Friday Jan 10 2014 00:15:32 EDT Ventricular Rate:  106 PR Interval:  120 QRS Duration: 74 QT Interval:  316 QTC Calculation: 419 R Axis:   77 Text Interpretation:  Sinus tachycardia Right atrial enlargement  Nonspecific ST abnormality Confirmed by Rosha Cocker  MD, Imir Brumbach ([id number]) on  01/10/2014 3:55:11 AM       Sunnie NielsenBrian Terrall Bley, MD 01/10/14 [phone]

## 2014-01-10 NOTE — ED Provider Notes (Signed)
CSN: [id number]     Arrival date & time 01/10/14  0011 History   First MD Initiated Contact with Patient 01/10/14 [phone]     Chief Complaint  Patient presents with  . Chest Pain  . Dizziness     (Consider location/radiation/quality/duration/timing/severity/associated sxs/prior Treatment) HPI History provided by pt.   Pt presents w/ tightness at LSB since 8pm last night.  Was laying down when it started.   Constant, non-radiating, associated w/ generalized weakness and orthostatic lightheadedness since 2pm.  Denies fever, cough, SOB, abd pain, N/V, LE edema/pain.  Did not take anything for sx.  Has h/o PE but this pain feels completely different.  Pt also has h/o SLE and SVT, for which she takes plaquenil and cardizem.  Is occasionally on prednisone as well.  No recent change in medications.  Has been eating/drinking normally.  Denies drug abuse.  Denies trauma.  Past Medical History  Diagnosis Date  . Vitamin D deficiency   . Celiac disease   . Vitamin D deficiency    Past Surgical History  Procedure Laterality Date  . Tonsillectomy     History reviewed. No pertinent family history. History  Substance Use Topics  . Smoking status: Never Smoker   . Smokeless tobacco: Not on file  . Alcohol Use: No   OB History   Grav Para Term Preterm Abortions TAB SAB Ect Mult Living                 Review of Systems  All other systems reviewed and are negative.     Allergies  Gluten meal  Home Medications   Prior to Admission medications   Medication Sig Start Date End Date Taking? Authorizing Provider  BIOTIN PO Take 1 tablet by mouth daily.   Yes Historical Provider, MD  cholecalciferol (VITAMIN D) 1000 UNITS tablet Take 1,000 Units by mouth daily.   Yes Historical Provider, MD  diltiazem (CARDIZEM) 30 MG tablet Take 30 mg by mouth 3 (three) times daily.   Yes Historical Provider, MD  DULoxetine (CYMBALTA) 20 MG capsule Take 20 mg by mouth 2 (two) times daily.   Yes Historical  Provider, MD  hydroxychloroquine (PLAQUENIL) 200 MG tablet Take 200 mg by mouth 2 (two) times daily.    Yes Historical Provider, MD  hydrOXYzine (ATARAX/VISTARIL) 25 MG tablet Take 25 mg by mouth 3 (three) times daily as needed for itching.   Yes Historical Provider, MD  medroxyPROGESTERone (DEPO-PROVERA) 150 MG/ML injection Inject 150 mg into the muscle every 3 (three) months.   Yes Historical Provider, MD  Multiple Vitamin (MULITIVITAMIN WITH MINERALS) TABS Take 1 tablet by mouth daily.   Yes Historical Provider, MD  Omega-3 Fatty Acids (FISH OIL) 1000 MG CAPS Take 1 capsule by mouth daily.   Yes Historical Provider, MD   BP 108/79  Pulse 89  Temp(Src) 98.2 F (36.8 C) (Oral)  Resp 20  SpO2 100% Physical Exam  Nursing note and vitals reviewed. Constitutional: She is oriented to person, place, and time. She appears well-developed and well-nourished. No distress.  HENT:  Head: Normocephalic and atraumatic.  Eyes:  Normal appearance  Neck: Normal range of motion.  Cardiovascular: Normal rate, regular rhythm and intact distal pulses.   Pulmonary/Chest: Effort normal and breath sounds normal. No respiratory distress. She exhibits no tenderness.  No pleuritic pain reported.  CP mildly improved by sitting upright.   Abdominal: Soft. Bowel sounds are normal. She exhibits no distension. There is no tenderness. There is no guarding.  Musculoskeletal: Normal range of motion.  No peripheral edema or calf tenderness  Neurological: She is alert and oriented to person, place, and time.  Skin: Skin is warm and dry. No rash noted.  Psychiatric: She has a normal mood and affect. Her behavior is normal.    ED Course  Procedures (including critical care time) Labs Review Labs Reviewed  URINALYSIS, ROUTINE W REFLEX MICROSCOPIC - Abnormal; Notable for the following:    Leukocytes, UA TRACE (*)    All other components within normal limits  I-STAT CHEM 8, ED - Abnormal; Notable for the following:     Potassium 3.5 (*)    Calcium, Ion 1.34 (*)    All other components within normal limits  PREGNANCY, URINE  D-DIMER, QUANTITATIVE  CBC  URINE MICROSCOPIC-ADD ON  Rosezena SensorI-STAT TROPOININ, ED    Imaging Review Dg Chest 2 View  01/10/2014   CLINICAL DATA:  Chest pain and dizziness  EXAM: CHEST  2 VIEW  COMPARISON:  12/27/2011  FINDINGS: Normal heart size and mediastinal contours. No acute infiltrate or edema. No effusion or pneumothorax. No acute osseous findings.  IMPRESSION: No active cardiopulmonary disease.   Electronically Signed   By: Tiburcio PeaJonathan  Watts M.D.   On: 01/10/2014 05:04     EKG Interpretation   Date/Time:  Friday Jan 10 2014 00:15:32 EDT Ventricular Rate:  106 PR Interval:  120 QRS Duration: 74 QT Interval:  316 QTC Calculation: 419 R Axis:   77 Text Interpretation:  Sinus tachycardia Right atrial enlargement  Nonspecific ST abnormality Confirmed by OPITZ  MD, BRIAN ([id number]) on  01/10/2014 3:55:11 AM      MDM   Final diagnoses:  Chest wall pain    22yo F w/ possible SLE as well as SVT and h/o PE presents w/ non-traumatic pain at LSB since 8pm yesterday.  Has had generalized weakness and orthostatic lightheadedness since 2pm.  Well-hydrated, VS w/in nml range, no orthostasis, non-toxic appearing, mild tenderness LSB, no signs of DVT, erythematous macular lesion R anterior thigh on exam.  Skin lesion noted by pt in the last 24 hours and she has had similar in the past.  Non-pruritic/non-painful.  EKG unremarkable.  D-dimer neg and labs otherwise unremarkable.  Neg CXR.  RF for pericarditis and pain mildly improved w/ leaning forward, but no fever or evidence of pericarditis on EKG.  Will treat symptomatically for chest wall pain w/ NSAID, rest, heat/ice.  Advised f/u with rheumatologist as this may be a lupus exacerbation.  Return precautions discussed.     Otilio Miuatherine E Khizar Fiorella, PA-C 01/10/14 0557  Otilio Miuatherine E Markos Theil, PA-C 01/10/14 [phone]

## 2014-08-18 IMAGING — US US PELVIS COMPLETE
1 series · 13 of 25 positions shown · non-contrast
Comparison: None.

CLINICAL DATA: Vaginal bleeding and left-sided pelvic discomfort

EXAM:
TRANSABDOMINAL AND TRANSVAGINAL ULTRASOUND OF PELVIS
DOPPLER ULTRASOUND OF OVARIES
TECHNIQUE: Both transabdominal and transvaginal ultrasound examinations of the
pelvis were performed. Transabdominal technique was performed for
global imaging of the pelvis including uterus, ovaries, adnexal
regions, and pelvic cul-de-sac.
It was necessary to proceed with endovaginal exam following the
transabdominal exam to visualize the endometrium and adnexal
structures. Color and duplex Doppler ultrasound was utilized to
evaluate blood flow to the ovaries.

[Series 1: us pelvis complete · 0.20mm/px · 82 acquisitions, 13 frames shown]
[im 1/82]
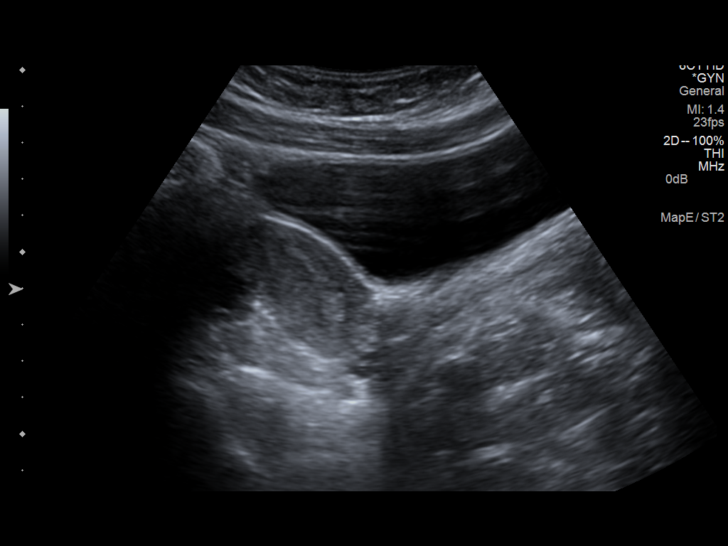
[im 7/82]
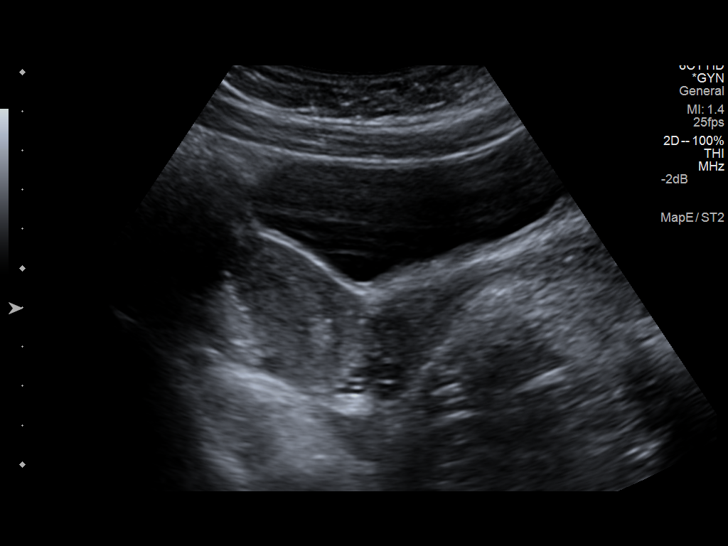
[im 14/82]
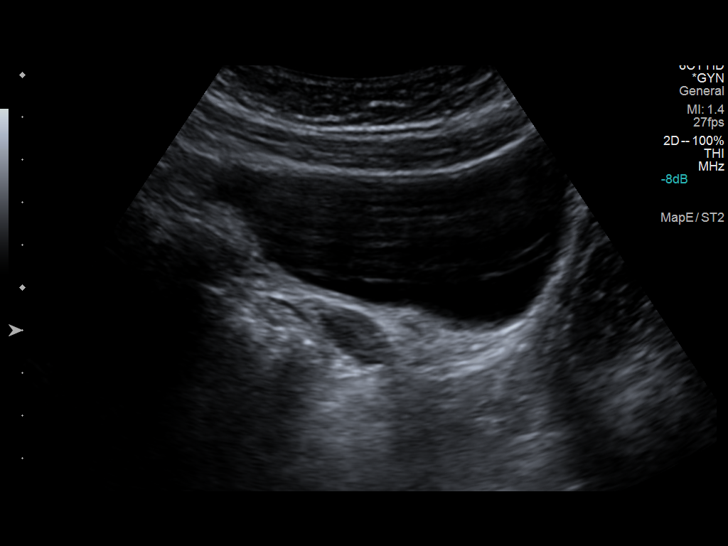
[im 21/82]
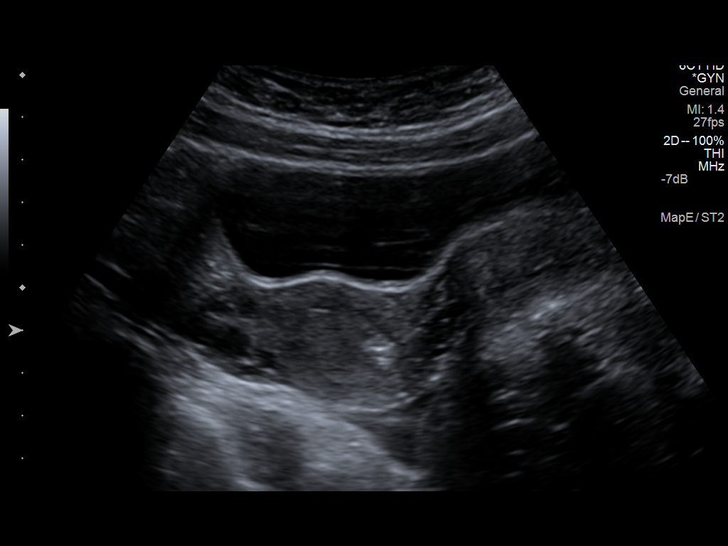
[im 28/82]
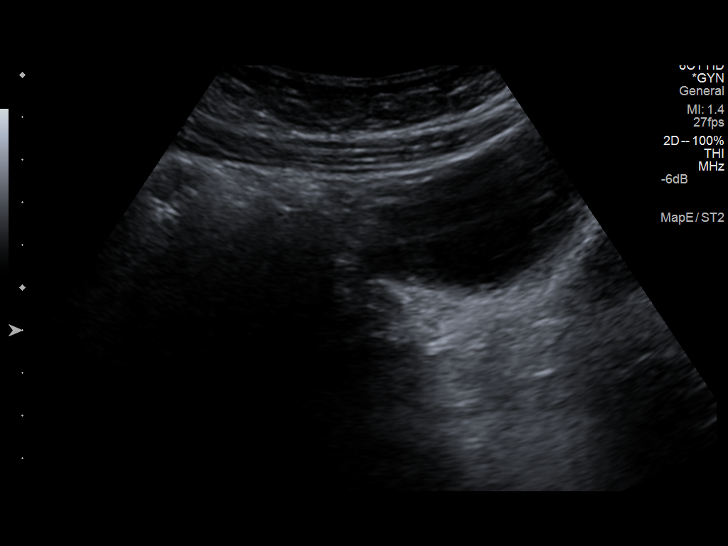
[im 34/82]
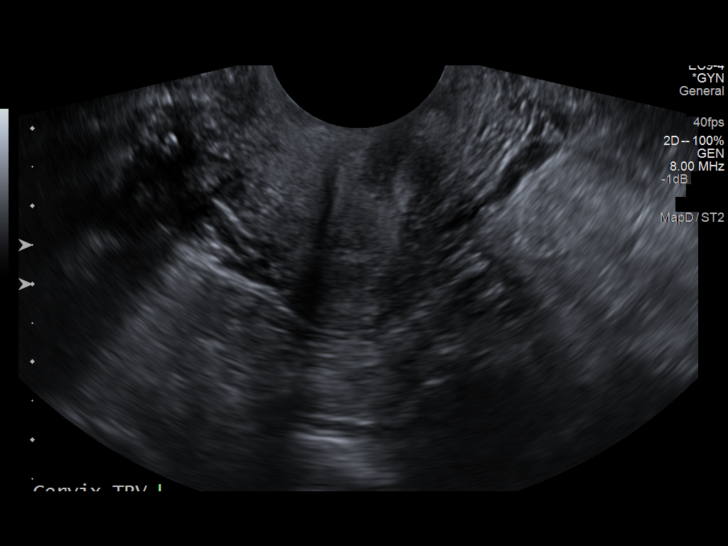
[im 41/82]
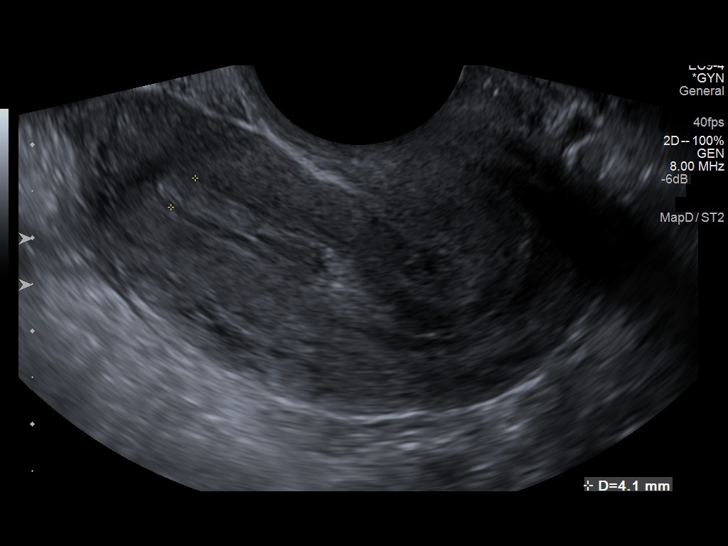
[im 48/82]
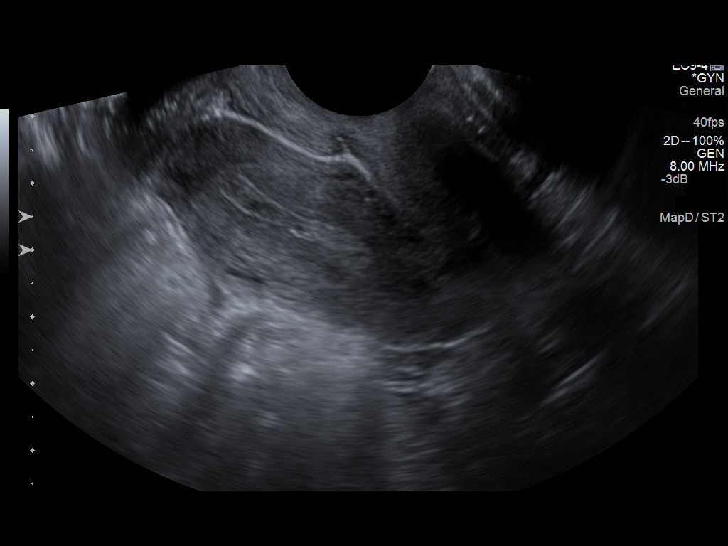
[im 55/82]
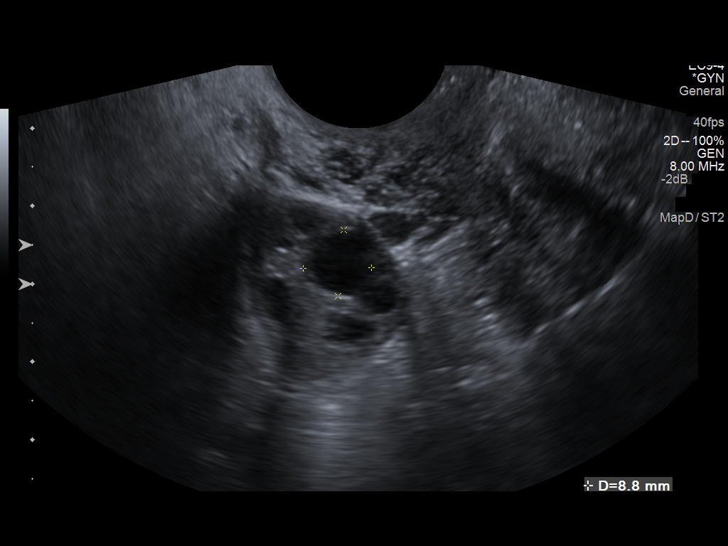
[im 61/82]
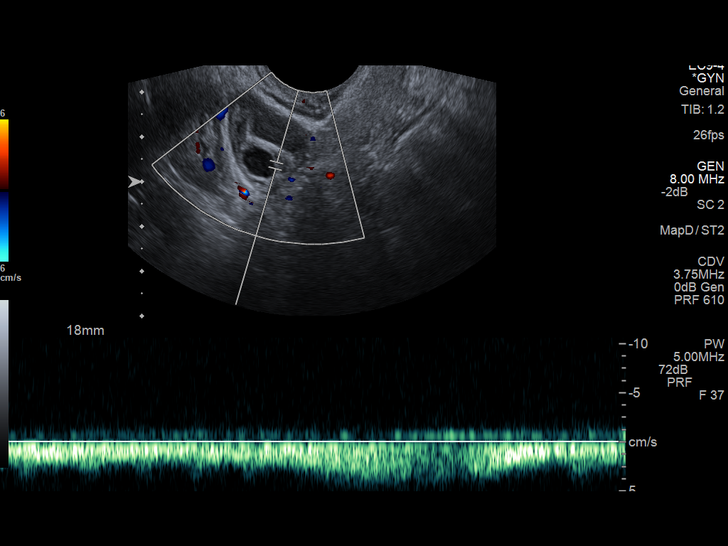
[im 68/82]
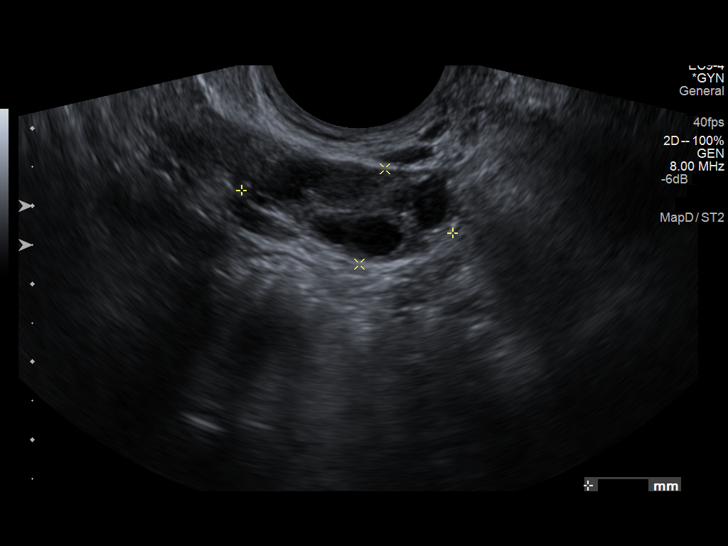
[im 75/82]
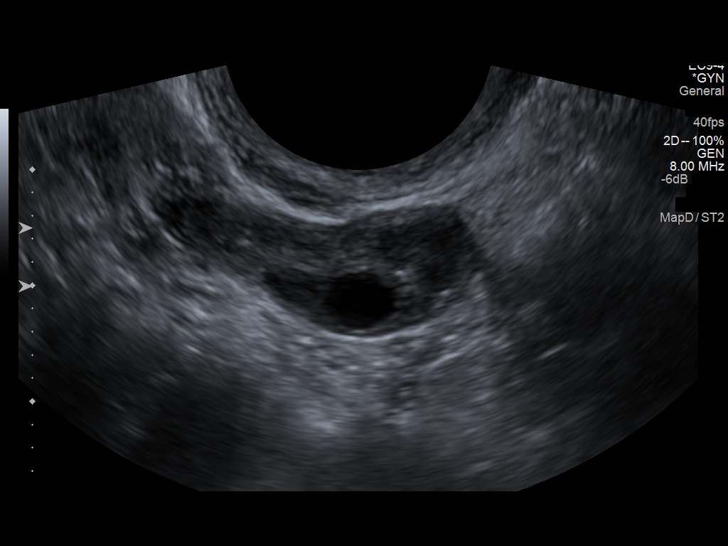
[im 82/82]
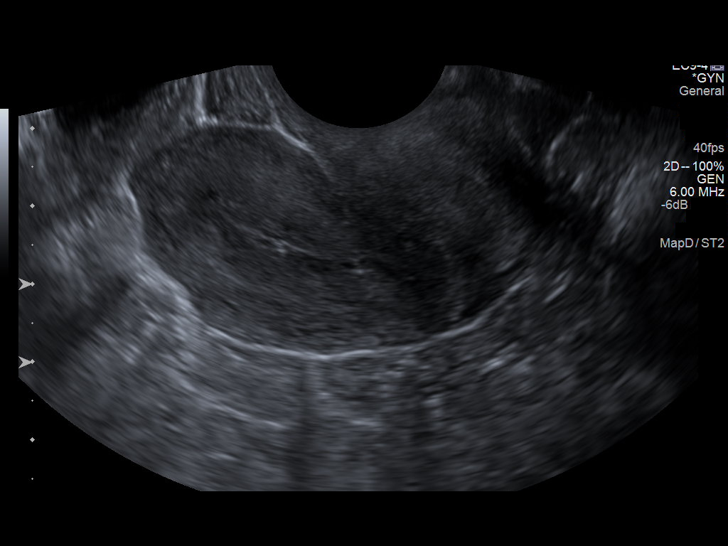

[13 of 25 positions shown; findings below may reference images not displayed]

FINDINGS: Uterus

Measurements: 5.4 x 2.2 x 4.1 cm. No fibroids or other mass
visualized.

Endometrium

Thickness: 4.1 mm.. There is fluid containing debris within the
endometrial cavity.

Right ovary

Measurements: 2.7 x 1.7 x 1.9 cm.. There is a 0.9 cm diameter cyst
present as well as developing follicles.

Left ovary

Measurements: 2.8 x 1.3 x 2.4 cm.. There is a 0.8 cm maximal
diameter cyst.

Pulsed Doppler evaluation of both ovaries demonstrates normal
low-resistance arterial and venous waveforms.

Other findings

No free pelvic fluid is demonstrated.
IMPRESSION: 1. The ovaries are normal in echotexture and contour and
vascularity.
2. The endometrium does not appear abnormally thickened but there is
a complex appearance of fluid within the endometrial cavity which
may reflect blood products. The uterine corpus is normal in
echotexture.
